# Patient Record
Sex: Male | Born: 1955 | Race: White | Hispanic: No | Marital: Married | Smoking: Current every day smoker
Health system: Southern US, Community
[De-identification: ages and names within clinical notes are randomized; demographics above are authoritative.]

## PROBLEM LIST (undated history)

## (undated) DIAGNOSIS — J449 Chronic obstructive pulmonary disease, unspecified: Secondary | ICD-10-CM

## (undated) HISTORY — PX: KNEE ARTHROSCOPY W/ ACL RECONSTRUCTION: SHX1858

---

## 2001-11-14 ENCOUNTER — Encounter: Admission: RE | Admit: 2001-11-14 | Discharge: 2001-11-14 | Payer: Self-pay | Admitting: Family Medicine

## 2001-11-14 ENCOUNTER — Encounter: Payer: Self-pay | Admitting: Family Medicine

## 2005-07-18 ENCOUNTER — Emergency Department (HOSPITAL_COMMUNITY): Admission: EM | Admit: 2005-07-18 | Discharge: 2005-07-19 | Payer: Self-pay | Admitting: Emergency Medicine

## 2005-12-14 ENCOUNTER — Emergency Department (HOSPITAL_COMMUNITY): Admission: EM | Admit: 2005-12-14 | Discharge: 2005-12-14 | Payer: Self-pay | Admitting: Emergency Medicine

## 2006-02-16 ENCOUNTER — Emergency Department (HOSPITAL_COMMUNITY): Admission: EM | Admit: 2006-02-16 | Discharge: 2006-02-16 | Payer: Self-pay | Admitting: Emergency Medicine

## 2006-02-20 ENCOUNTER — Encounter: Payer: Self-pay | Admitting: Internal Medicine

## 2006-02-20 ENCOUNTER — Ambulatory Visit: Payer: Self-pay | Admitting: Cardiology

## 2006-02-20 ENCOUNTER — Inpatient Hospital Stay (HOSPITAL_COMMUNITY): Admission: EM | Admit: 2006-02-20 | Discharge: 2006-02-21 | Payer: Self-pay | Admitting: Emergency Medicine

## 2007-05-07 ENCOUNTER — Emergency Department (HOSPITAL_COMMUNITY): Admission: EM | Admit: 2007-05-07 | Discharge: 2007-05-07 | Payer: Self-pay | Admitting: Emergency Medicine

## 2007-08-03 ENCOUNTER — Emergency Department (HOSPITAL_COMMUNITY): Admission: EM | Admit: 2007-08-03 | Discharge: 2007-08-03 | Payer: Self-pay | Admitting: Family Medicine

## 2008-01-03 ENCOUNTER — Emergency Department (HOSPITAL_COMMUNITY): Admission: EM | Admit: 2008-01-03 | Discharge: 2008-01-03 | Payer: Self-pay | Admitting: Emergency Medicine

## 2008-01-29 ENCOUNTER — Emergency Department (HOSPITAL_COMMUNITY): Admission: EM | Admit: 2008-01-29 | Discharge: 2008-01-29 | Payer: Self-pay | Admitting: Emergency Medicine

## 2008-06-11 ENCOUNTER — Emergency Department (HOSPITAL_COMMUNITY): Admission: EM | Admit: 2008-06-11 | Discharge: 2008-06-11 | Payer: Self-pay | Admitting: Emergency Medicine

## 2009-01-20 ENCOUNTER — Emergency Department (HOSPITAL_COMMUNITY): Admission: EM | Admit: 2009-01-20 | Discharge: 2009-01-20 | Payer: Self-pay | Admitting: Internal Medicine

## 2009-05-26 ENCOUNTER — Emergency Department (HOSPITAL_COMMUNITY): Admission: EM | Admit: 2009-05-26 | Discharge: 2009-05-27 | Payer: Self-pay | Admitting: Emergency Medicine

## 2009-05-28 ENCOUNTER — Emergency Department (HOSPITAL_COMMUNITY): Admission: EM | Admit: 2009-05-28 | Discharge: 2009-05-28 | Payer: Self-pay | Admitting: Emergency Medicine

## 2009-09-12 ENCOUNTER — Emergency Department (HOSPITAL_COMMUNITY): Admission: EM | Admit: 2009-09-12 | Discharge: 2009-09-12 | Payer: Self-pay | Admitting: Emergency Medicine

## 2009-10-26 ENCOUNTER — Emergency Department (HOSPITAL_COMMUNITY): Admission: EM | Admit: 2009-10-26 | Discharge: 2009-10-26 | Payer: Self-pay | Admitting: Emergency Medicine

## 2010-01-22 ENCOUNTER — Emergency Department (HOSPITAL_COMMUNITY): Admission: EM | Admit: 2010-01-22 | Discharge: 2010-01-22 | Payer: Self-pay | Admitting: Emergency Medicine

## 2010-11-30 LAB — CBC
Hemoglobin: 15.8 g/dL (ref 13.0–17.0)
MCHC: 34.1 g/dL (ref 30.0–36.0)
MCV: 94.3 fL (ref 78.0–100.0)
Platelets: 228 10*3/uL (ref 150–400)
RBC: 4.91 MIL/uL (ref 4.22–5.81)

## 2010-11-30 LAB — BASIC METABOLIC PANEL
CO2: 23 mEq/L (ref 19–32)
Chloride: 109 mEq/L (ref 96–112)
GFR calc Af Amer: >60 mL/min (ref >60)

## 2010-11-30 LAB — DIFFERENTIAL
Basophils Relative: 0 % (ref 0–1)
Eosinophils Relative: 2 % (ref 0–5)
Lymphocytes Relative: 10 % — ABNORMAL LOW (ref 12–46)
Lymphs Abs: 1.6 10*3/uL (ref 0.7–4.0)
Monocytes Relative: 2 % — ABNORMAL LOW (ref 3–12)
Neutrophils Relative %: 85 % — ABNORMAL HIGH (ref 43–77)

## 2010-11-30 LAB — CULTURE, BLOOD (ROUTINE X 2)
Culture: NO GROWTH
Culture: NO GROWTH

## 2010-11-30 LAB — BLOOD GAS, ARTERIAL
Acid-Base Excess: 0.8 mmol/L (ref 0.0–2.0)
Drawn by: 235321
FIO2: 0.21 %
TCO2: 21.3 mmol/L (ref 0–100)
pH, Arterial: 7.43 (ref 7.350–7.450)
pO2, Arterial: 71.1 mmHg — ABNORMAL LOW (ref 80.0–100.0)

## 2010-12-01 LAB — POCT I-STAT, CHEM 8
BUN: 12 mg/dL (ref 6–23)
Calcium, Ion: 1.09 mmol/L — ABNORMAL LOW (ref 1.12–1.32)
Chloride: 106 mEq/L (ref 96–112)
Creatinine, Ser: 1 mg/dL (ref 0.4–1.5)
Glucose, Bld: 93 mg/dL (ref 70–99)
HCT: 46 % (ref 39.0–52.0)
Sodium: 139 mEq/L (ref 135–145)
TCO2: 23 mmol/L (ref 0–100)

## 2010-12-01 LAB — CBC
Platelets: 214 10*3/uL (ref 150–400)
RBC: 4.74 MIL/uL (ref 4.22–5.81)
RDW: 13.6 % (ref 11.5–15.5)

## 2010-12-01 LAB — DIFFERENTIAL
Basophils Relative: 0 % (ref 0–1)
Lymphocytes Relative: 15 % (ref 12–46)
Lymphs Abs: 1.9 10*3/uL (ref 0.7–4.0)

## 2010-12-12 ENCOUNTER — Emergency Department (HOSPITAL_COMMUNITY): Payer: Self-pay

## 2010-12-12 ENCOUNTER — Emergency Department (HOSPITAL_COMMUNITY)
Admission: EM | Admit: 2010-12-12 | Discharge: 2010-12-12 | Disposition: A | Discharge status: Home / Self Care | Payer: Self-pay | Attending: Emergency Medicine | Admitting: Emergency Medicine

## 2010-12-12 DIAGNOSIS — M7989 Other specified soft tissue disorders: Secondary | ICD-10-CM | POA: Insufficient documentation

## 2010-12-12 DIAGNOSIS — L03019 Cellulitis of unspecified finger: Secondary | ICD-10-CM | POA: Insufficient documentation

## 2010-12-12 DIAGNOSIS — M79609 Pain in unspecified limb: Secondary | ICD-10-CM | POA: Insufficient documentation

## 2011-01-12 NOTE — H&P (Signed)
[name], Wells              ACCOUNT NO.:  [id number]   MEDICAL RECORD NO.:  [id number]          PATIENT TYPE:  INP   LOCATION:  1828                         FACILITY:  [facility]   PHYSICIAN:  Corinna L. Lendell Caprice, MDDATE OF BIRTH:  1955-11-06   DATE OF ADMISSION:  02/19/2006  DATE OF DISCHARGE:                                HISTORY & PHYSICAL   CHIEF COMPLAINTS:  Chest pain.   HISTORY OF PRESENT ILLNESS:  Mr. Blanchfield is a 55 year old unassigned white  male who presents to the emergency room with chest pressure.  He had the  same complaint several nights ago and left against medical advice.  He  reports that he has had this chest pressure periodically for the past week;  sometimes it is accompanied by shortness of breath, sometimes it is  accompanied by nausea.  He feels very fatigued.  The pain is gone.  It  lasted an hour today.  He smokes cigarettes.  He admits to using cocaine on  Friday and is somewhat vague about his use but from what I can gather uses  it possibly once a week.  He reports that he has felt this chest pain in the  past but feels that it is unrelated temporally to his cocaine use.  He  smokes two packs of cigarettes a day.  His father had a heart attack in his  early 98s.  The patient has dyspnea on exertion as well.  He has had no  previous cardiac workup.   PAST MEDICAL HISTORY:  He has had knee surgery for a torn cruciate ligament.   SOCIAL HISTORY:  The patient works in [employer].  He snorts cocaine  occasionally.  He smokes two packs of cigarettes a day.  He drinks alcohol  periodically but will not further qualify this.  He is here with his  girlfriend.   FAMILY HISTORY:  His brother has hypertension.  His father has history of  heart disease which began in his early 30s.   REVIEW OF SYSTEMS:  As above, otherwise negative.   PHYSICAL EXAMINATION:  VITAL SIGNS:  His temperature is 97.5, blood pressure  148/85, pulse 56, respiratory rate 18, oxygen  saturation 97% on room air.  GENERAL:  The patient is a tired-appearing white male in no acute distress.  HEENT:  Normocephalic, atraumatic.  Pupils equal, round, reactive to light.  Sclerae nonicteric.  Moist mucous membranes.  Neck is supple.  No  lymphadenopathy.  LUNGS:  Clear to auscultation bilaterally without wheezes, rhonchi or rales.  CARDIOVASCULAR:  Regular rate and rhythm without murmurs, gallops or rubs.  No chest wall tenderness.  ABDOMEN:  Soft, nontender, nondistended.  GU/RECTAL:  Deferred.  EXTREMITIES:  No clubbing, cyanosis or edema.  SKIN:  No rash.  PSYCHIATRIC:  Normal affect, calm and cooperative.  NEUROLOGIC:  Alert and oriented.  Cranial nerves and sensory motor exam are  intact.   LABS:  Two sets of point care enzymes are negative.  BMET is unremarkable.  Hemoglobin/hematocrit normal.  EKG shows sinus bradycardia.  Chest x-ray  shows probable nipple shadows.   ASSESSMENT/PLAN:  1.  Chest pain:  I will admit to rule out myocardial infarction.  Certainly      he has risk factors for heart disease.  I will check a fasting lipid      panel and order a tobacco cessation counseling.  I will continue an      aspirin a day and give Lovenox at ACS dose.  I do believe that it would      be worthwhile discussing with cardiology in the morning since he has      used cocaine within the past week.  They may elect to do an outpatient      stress test once he has been off the cocaine for a few weeks.      Certainly, however, I do feel that he would need an eventual stress test      if not during this hospitalization.  I will also check chest x-ray with      nipple markers.  I will make the patient n.p.o. after midnight in case      he ends up getting a stress test tomorrow.      Corinna L. Lendell Caprice, MD  Electronically Signed     CLS/MEDQ  D:  02/19/2006  T:  02/20/2006  Job:  [id number]

## 2011-04-16 ENCOUNTER — Emergency Department (HOSPITAL_COMMUNITY)
Admission: EM | Admit: 2011-04-16 | Discharge: 2011-04-16 | Disposition: A | Discharge status: Home / Self Care | Payer: Self-pay | Attending: Emergency Medicine | Admitting: Emergency Medicine

## 2011-04-16 DIAGNOSIS — R630 Anorexia: Secondary | ICD-10-CM | POA: Insufficient documentation

## 2011-04-16 DIAGNOSIS — R221 Localized swelling, mass and lump, neck: Secondary | ICD-10-CM | POA: Insufficient documentation

## 2011-04-16 DIAGNOSIS — R220 Localized swelling, mass and lump, head: Secondary | ICD-10-CM | POA: Insufficient documentation

## 2011-04-16 DIAGNOSIS — K029 Dental caries, unspecified: Secondary | ICD-10-CM | POA: Insufficient documentation

## 2011-04-16 DIAGNOSIS — Z8639 Personal history of other endocrine, nutritional and metabolic disease: Secondary | ICD-10-CM | POA: Insufficient documentation

## 2011-04-16 DIAGNOSIS — K089 Disorder of teeth and supporting structures, unspecified: Secondary | ICD-10-CM | POA: Insufficient documentation

## 2011-04-16 DIAGNOSIS — Z862 Personal history of diseases of the blood and blood-forming organs and certain disorders involving the immune mechanism: Secondary | ICD-10-CM | POA: Insufficient documentation

## 2011-11-27 ENCOUNTER — Emergency Department (HOSPITAL_BASED_OUTPATIENT_CLINIC_OR_DEPARTMENT_OTHER)
Admission: EM | Admit: 2011-11-27 | Discharge: 2011-11-27 | Disposition: A | Discharge status: Home / Self Care | Payer: 59 | Attending: Emergency Medicine | Admitting: Emergency Medicine

## 2011-11-27 ENCOUNTER — Encounter (HOSPITAL_BASED_OUTPATIENT_CLINIC_OR_DEPARTMENT_OTHER): Payer: Self-pay | Admitting: *Deleted

## 2011-11-27 ENCOUNTER — Other Ambulatory Visit: Payer: Self-pay

## 2011-11-27 ENCOUNTER — Emergency Department (INDEPENDENT_AMBULATORY_CARE_PROVIDER_SITE_OTHER): Payer: 59

## 2011-11-27 DIAGNOSIS — F17200 Nicotine dependence, unspecified, uncomplicated: Secondary | ICD-10-CM | POA: Insufficient documentation

## 2011-11-27 DIAGNOSIS — M47814 Spondylosis without myelopathy or radiculopathy, thoracic region: Secondary | ICD-10-CM

## 2011-11-27 DIAGNOSIS — R0602 Shortness of breath: Secondary | ICD-10-CM

## 2011-11-27 DIAGNOSIS — J441 Chronic obstructive pulmonary disease with (acute) exacerbation: Secondary | ICD-10-CM | POA: Insufficient documentation

## 2011-11-27 DIAGNOSIS — J45909 Unspecified asthma, uncomplicated: Secondary | ICD-10-CM

## 2011-11-27 DIAGNOSIS — R5381 Other malaise: Secondary | ICD-10-CM | POA: Insufficient documentation

## 2011-11-27 HISTORY — DX: Chronic obstructive pulmonary disease, unspecified: J44.9

## 2011-11-27 LAB — DIFFERENTIAL
Basophils Absolute: 0 K/uL (ref 0.0–0.1)
Basophils Relative: 0 % (ref 0–1)
Eosinophils Absolute: 0.1 K/uL (ref 0.0–0.7)
Eosinophils Relative: 1 % (ref 0–5)
Lymphocytes Relative: 17 % (ref 12–46)
Lymphs Abs: 1.2 K/uL (ref 0.7–4.0)
Monocytes Absolute: 1 K/uL (ref 0.1–1.0)
Monocytes Relative: 14 % — ABNORMAL HIGH (ref 3–12)
Neutro Abs: 4.7 K/uL (ref 1.7–7.7)
Neutrophils Relative %: 67 % (ref 43–77)

## 2011-11-27 LAB — CBC
HCT: 45.5 % (ref 39.0–52.0)
Hemoglobin: 15.6 g/dL (ref 13.0–17.0)
MCH: 30.8 pg (ref 26.0–34.0)
MCHC: 34.3 g/dL (ref 30.0–36.0)
MCV: 89.7 fL (ref 78.0–100.0)
Platelets: 169 K/uL (ref 150–400)
RBC: 5.07 MIL/uL (ref 4.22–5.81)
RDW: 12.8 % (ref 11.5–15.5)
WBC: 6.9 K/uL (ref 4.0–10.5)

## 2011-11-27 LAB — BASIC METABOLIC PANEL WITH GFR
BUN: 15 mg/dL (ref 6–23)
CO2: 22 meq/L (ref 19–32)
Calcium: 9.2 mg/dL (ref 8.4–10.5)
Chloride: 102 meq/L (ref 96–112)
Creatinine, Ser: 0.9 mg/dL (ref 0.50–1.35)
GFR calc Af Amer: >90 mL/min (ref >90)
GFR calc non Af Amer: >90 mL/min (ref >90)
Glucose, Bld: 110 mg/dL — ABNORMAL HIGH (ref 70–99)
Potassium: 4 meq/L (ref 3.5–5.1)
Sodium: 137 meq/L (ref 135–145)

## 2011-11-27 MED ORDER — IPRATROPIUM BROMIDE 0.02 % IN SOLN
RESPIRATORY_TRACT | Status: AC
  Administered 2011-11-27: 0.5 mg via RESPIRATORY_TRACT
  Filled 2011-11-27: qty 2.5

## 2011-11-27 MED ORDER — DOXYCYCLINE HYCLATE 100 MG PO CAPS: 100.0000 mg | ORAL_CAPSULE | Freq: Two times a day (BID) | ORAL | Status: AC

## 2011-11-27 MED ORDER — PREDNISONE 50 MG PO TABS
60.0000 mg | ORAL_TABLET | Freq: Once | ORAL | Status: AC
  Administered 2011-11-27: 60 mg via ORAL
  Filled 2011-11-27: qty 1

## 2011-11-27 MED ORDER — SODIUM CHLORIDE 0.9 % IV SOLN: INTRAVENOUS | Status: DC

## 2011-11-27 MED ORDER — ALBUTEROL SULFATE (5 MG/ML) 0.5% IN NEBU
5.0000 mg | INHALATION_SOLUTION | Freq: Once | RESPIRATORY_TRACT | Status: AC
  Administered 2011-11-27: 5 mg via RESPIRATORY_TRACT

## 2011-11-27 MED ORDER — IPRATROPIUM BROMIDE 0.02 % IN SOLN
0.5000 mg | Freq: Once | RESPIRATORY_TRACT | Status: AC
  Administered 2011-11-27: 0.5 mg via RESPIRATORY_TRACT

## 2011-11-27 MED ORDER — ALBUTEROL (5 MG/ML) CONTINUOUS INHALATION SOLN
10.0000 mg/h | INHALATION_SOLUTION | Freq: Once | RESPIRATORY_TRACT | Status: AC
  Administered 2011-11-27: 10 mg/h via RESPIRATORY_TRACT
  Filled 2011-11-27: qty 20

## 2011-11-27 MED ORDER — ALBUTEROL SULFATE (5 MG/ML) 0.5% IN NEBU
INHALATION_SOLUTION | RESPIRATORY_TRACT | Status: AC
  Filled 2011-11-27: qty 0.5

## 2011-11-27 MED ORDER — PREDNISONE 50 MG PO TABS: ORAL_TABLET | ORAL | Status: AC

## 2011-11-27 MED ORDER — ALBUTEROL SULFATE HFA 108 (90 BASE) MCG/ACT IN AERS: 1.0000 | INHALATION_SPRAY | Freq: Four times a day (QID) | RESPIRATORY_TRACT | Status: DC | PRN

## 2011-11-27 MED ORDER — SODIUM CHLORIDE 0.9 % IV BOLUS (SEPSIS)
1000.0000 mL | Freq: Once | INTRAVENOUS | Status: AC
  Administered 2011-11-27: 1000 mL via INTRAVENOUS

## 2011-11-27 MED ORDER — ALBUTEROL SULFATE (5 MG/ML) 0.5% IN NEBU
INHALATION_SOLUTION | RESPIRATORY_TRACT | Status: AC
  Administered 2011-11-27: 5 mg via RESPIRATORY_TRACT
  Filled 2011-11-27: qty 1
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
## 2011-11-27 NOTE — ED Notes (Signed)
Pt was ambulated x 1 around the ED on RA, pt tolerated well and oxygen saturation only went as low as 93% but when pt was stationary oxygen levels were 97%. Pt stated that he felt well.

## 2011-11-27 NOTE — ED Notes (Signed)
Sunday Pt. Began with shortness of breath and Monday Pt. Became worse with SHOB and fatigue.  Pt. Is in no resp. Distress and has wheezes bilat.

## 2011-11-27 NOTE — ED Notes (Signed)
Pt presented to the ED with SOB, increased WOB, and wheezing. Patient was just started on a continuous nebulizer with Albuterol 10 mg which will be given over 1 hour. BBS- expiratory wheezes. Patient is currently tolerating nebulizer well with no complications. Rt will cont to monitor.

## 2011-11-27 NOTE — ED Notes (Signed)
Pt was assess on his CAT HHN tx, pt seemed to be doing well and stated that he didn't feel as tight as he was when he came in. Respiratory will walk the pt on a POX room air trial to see how pt tolerates ambulation.

## 2011-11-27 NOTE — Discharge Instructions (Signed)
Chronic Obstructive Pulmonary Disease Chronic obstructive pulmonary disease (COPD) is a condition in which airflow from the lungs is restricted. The lungs can never return to normal, but there are measures you can take which will improve them and make you feel better. CAUSES   Smoking.   Exposure to secondhand smoke.   Breathing in irritants (pollution, cigarette smoke, strong smells, aerosol sprays, paint fumes).   History of lung infections.  TREATMENT  Treatment focuses on making you comfortable (supportive care). Your caregiver may prescribe medications (inhaled or pills) to help improve your breathing. HOME CARE INSTRUCTIONS   If you smoke, stop smoking.   Avoid exposure to smoke, chemicals, and fumes that aggravate your breathing.   Take antibiotic medicines as directed by your caregiver.   Avoid medicines that dry up your system and slow down the elimination of secretions (antihistamines and cough syrups). This decreases respiratory capacity and may lead to infections.   Drink enough water and fluids to keep your urine clear or pale yellow. This loosens secretions.   Use humidifiers at home and at your bedside if they do not make breathing difficult.   Receive all protective vaccines your caregiver suggests, especially pneumococcal and influenza.   Use home oxygen as suggested.   Stay active. Exercise and physical activity will help maintain your ability to do things you want to do.   Eat a healthy diet.  SEEK MEDICAL CARE IF:   You develop pus-like mucus (sputum).   Breathing is more labored or exercise becomes difficult to do.   You are running out of the medicine you take for your breathing.  SEEK IMMEDIATE MEDICAL CARE IF:   You have a rapid heart rate.   You have agitation, confusion, tremors, or are in a stupor (family members may need to observe this).   It becomes difficult to breathe.   You develop chest pain.   You have a fever.  MAKE SURE YOU:    Understand these instructions.   Will watch your condition.   Will get help right away if you are not doing well or get worse.  Document Released: 05/23/2005 Document Revised: 08/02/2011 Document Reviewed: 10/13/2010 [facility] Patient Information 2012 [place], [place].

## 2011-11-27 NOTE — ED Provider Notes (Signed)
History     CSN: [id number]  Arrival date & time 11/27/11  1826   First MD Initiated Contact with Patient 11/27/11 1844      Chief Complaint  Patient presents with  . Shortness of Breath    also c/o fatigue    (Consider location/radiation/quality/duration/timing/severity/associated sxs/prior treatment) HPI Comments: Patient complains of 2 days of shortness of breath, fatigue and generalized weakness. He has a history of COPD smokes one pack a day. He sees a nebulizer tonight twice at home without relief. He denies any chest pain, cough, fever, abdominal pain, nausea vomiting. He has never been hospitalized for COPD.  The history is provided by the patient and the spouse.    Past Medical History  Diagnosis Date  . COPD (chronic obstructive pulmonary disease)   . Asthma     Past Surgical History  Procedure Date  . Knee arthroscopy w/ acl reconstruction     No family history on file.  History  Substance Use Topics  . Smoking status: Not on file  . Smokeless tobacco: Not on file  . Alcohol Use:       Review of Systems  Constitutional: Positive for activity change, appetite change and fatigue. Negative for fever.  HENT: Positive for congestion and rhinorrhea.   Eyes: Negative for photophobia.  Respiratory: Positive for cough and shortness of breath. Negative for choking.   Cardiovascular: Negative for chest pain.  Gastrointestinal: Negative for nausea, vomiting and abdominal pain.  Genitourinary: Negative for dysuria.  Musculoskeletal: Negative for back pain.  Neurological: Negative for headaches.    Allergies  Review of patient's allergies indicates no known allergies.  Home Medications   Current Outpatient Rx  Name Route Sig Dispense Refill  . ALBUTEROL SULFATE HFA 108 (90 BASE) MCG/ACT IN AERS Inhalation Inhale 2 puffs into the lungs every 6 (six) hours as needed. For shortness of breath    . ALBUTEROL SULFATE (2.5 MG/3ML) 0.083% IN NEBU Nebulization Take  2.5 mg by nebulization every 6 (six) hours as needed. For shortness of breath    . AMLODIPINE BESYLATE PO Oral Take 1 tablet by mouth daily.    Marland Kitchen HYDROCODONE-IBUPROFEN 7.5-200 MG PO TABS Oral Take 1 tablet by mouth every 8 (eight) hours as needed. For pain    . PHENYLEPHRINE-APAP-GUAIFENESIN 10-650-400 MG/20ML PO LIQD Oral Take 20 mLs by mouth every 4 (four) hours as needed. For congestion      BP 107/53  Pulse 78  Temp(Src) 97.9 F (36.6 C) (Oral)  Resp 22  Ht 6' (1.829 m)  Wt 210 lb (95.255 kg)  BMI 28.48 kg/m2  SpO2 95%  Physical Exam  Constitutional: He is oriented to person, place, and time. He appears well-developed and well-nourished. No distress.  HENT:  Head: Normocephalic and atraumatic.  Mouth/Throat: Oropharynx is clear and moist. No oropharyngeal exudate.  Eyes: Conjunctivae are normal. Pupils are equal, round, and reactive to light.  Neck: Normal range of motion. Neck supple.  Cardiovascular: Normal rate, regular rhythm and normal heart sounds.   Pulmonary/Chest: Effort normal. He has wheezes.       Course breath sounds bilaterally with scattered wheezing good air exchange  Abdominal: Soft. There is no tenderness. There is no rebound and no guarding.  Musculoskeletal: Normal range of motion. He exhibits no edema and no tenderness.  Neurological: He is alert and oriented to person, place, and time. No cranial nerve deficit.  Skin: Skin is warm.    ED Course  Procedures (including critical care time)  Labs Reviewed  CBC  DIFFERENTIAL  BASIC METABOLIC PANEL  TROPONIN I   Dg Chest Port 1 View  11/27/2011  *RADIOLOGY REPORT*  Clinical Data: Shortness of breath.  History of asthma.  PORTABLE CHEST - 1 VIEW  Comparison: 05/28/2009  Findings: Accentuated lung markings are probably due to the semi erect AP nature of today's projection.  No overt airway thickening is observed.  No airspace opacity is identified.  Cardiac and mediastinal contours appear unremarkable.   Lower thoracic spondylosis noted.  IMPRESSION: 1.  Lower thoracic spondylosis.   Otherwise, no significant abnormality identified.  Original Report Authenticated By: Dellia Cloud, M.D.     No diagnosis found.    MDM  Shortness of breath consistent with COPD exacerbation. No chest pain, fever or cough. No hypoxia.  Given extent of wheezing patient placed on continuous nebulizer on arrival. At end of hour-long continuous nebulizer. His work of breathing has improved. He no longer feels short of breath. Remains coarse breath sounds bilaterally. He ambulated in the ED without any desaturations. He denies any chest pain or shortness of breath.  He feels ready to go home. We'll treat for COPD exacerbation with doxycycline, albuterol, prednisone.    Date: 11/27/2011  Rate: 64  Rhythm: normal sinus rhythm  QRS Axis: normal  Intervals: normal  ST/T Wave abnormalities: nonspecific ST changes  Conduction Disutrbances:none  Narrative Interpretation:   Old EKG Reviewed: unchanged  CRITICAL CARE Performed by: Glynn Octave   Total critical care time: 30  Critical care time was exclusive of separately billable procedures and treating other patients.  Critical care was necessary to treat or prevent imminent or life-threatening deterioration.  Critical care was time spent personally by me on the following activities: development of treatment plan with patient and/or surrogate as well as nursing, discussions with consultants, evaluation of patient's response to treatment, examination of patient, obtaining history from patient or surrogate, ordering and performing treatments and interventions, ordering and review of laboratory studies, ordering and review of radiographic studies, pulse oximetry and re-evaluation of patient's condition.         Glynn Octave, MD 11/28/11 1018

## 2011-11-28 NOTE — ED Notes (Signed)
Pt called requesting that we cancel prescriptions called into [facility] and call them into [facility] on [address]. Request granted by Lorre Nick and call made.

## 2012-04-09 ENCOUNTER — Emergency Department (HOSPITAL_COMMUNITY): Payer: 59

## 2012-04-09 ENCOUNTER — Emergency Department (HOSPITAL_COMMUNITY)
Admission: EM | Admit: 2012-04-09 | Discharge: 2012-04-09 | Disposition: A | Discharge status: Home / Self Care | Payer: 59 | Attending: Emergency Medicine | Admitting: Emergency Medicine

## 2012-04-09 DIAGNOSIS — R4182 Altered mental status, unspecified: Secondary | ICD-10-CM | POA: Insufficient documentation

## 2012-04-09 DIAGNOSIS — Z79899 Other long term (current) drug therapy: Secondary | ICD-10-CM | POA: Insufficient documentation

## 2012-04-09 DIAGNOSIS — J449 Chronic obstructive pulmonary disease, unspecified: Secondary | ICD-10-CM | POA: Insufficient documentation

## 2012-04-09 DIAGNOSIS — R509 Fever, unspecified: Secondary | ICD-10-CM | POA: Insufficient documentation

## 2012-04-09 DIAGNOSIS — J4489 Other specified chronic obstructive pulmonary disease: Secondary | ICD-10-CM | POA: Insufficient documentation

## 2012-04-09 LAB — COMPREHENSIVE METABOLIC PANEL
Albumin: 3.7 g/dL (ref 3.5–5.2)
Alkaline Phosphatase: 81 U/L (ref 39–117)
BUN: 13 mg/dL (ref 6–23)
CO2: 24 mEq/L (ref 19–32)
Chloride: 101 mEq/L (ref 96–112)
Creatinine, Ser: 0.94 mg/dL (ref 0.50–1.35)
GFR calc Af Amer: >90 mL/min (ref >90)
GFR calc non Af Amer: >90 mL/min (ref >90)
Glucose, Bld: 85 mg/dL (ref 70–99)
Potassium: 3.6 mEq/L (ref 3.5–5.1)
Total Bilirubin: 0.5 mg/dL (ref 0.3–1.2)

## 2012-04-09 LAB — URINALYSIS, ROUTINE W REFLEX MICROSCOPIC
Glucose, UA: NEGATIVE mg/dL
Ketones, ur: NEGATIVE mg/dL
Leukocytes, UA: NEGATIVE
Protein, ur: NEGATIVE mg/dL
Urobilinogen, UA: 1 mg/dL (ref 0.0–1.0)

## 2012-04-09 LAB — CBC
HCT: 38 % — ABNORMAL LOW (ref 39.0–52.0)
Hemoglobin: 13.3 g/dL (ref 13.0–17.0)
MCV: 88.4 fL (ref 78.0–100.0)
RBC: 4.3 MIL/uL (ref 4.22–5.81)
WBC: 9.7 10*3/uL (ref 4.0–10.5)

## 2012-04-09 LAB — CSF CELL COUNT WITH DIFFERENTIAL

## 2012-04-09 LAB — PROTIME-INR
INR: 1.16 (ref 0.00–1.49)
Prothrombin Time: 15 seconds (ref 11.6–15.2)

## 2012-04-09 LAB — GLUCOSE, CAPILLARY
Glucose-Capillary: 83 mg/dL (ref 70–99)
Glucose-Capillary: 88 mg/dL (ref 70–99)

## 2012-04-09 LAB — GRAM STAIN: Gram Stain: NONE SEEN

## 2012-04-09 LAB — PROTEIN AND GLUCOSE, CSF: Total  Protein, CSF: 48 mg/dL — ABNORMAL HIGH (ref 15–45)

## 2012-04-09 LAB — APTT: aPTT: 37 seconds (ref 24–37)

## 2012-04-09 MED ORDER — ACETAMINOPHEN 325 MG PO TABS
650.0000 mg | ORAL_TABLET | Freq: Once | ORAL | Status: AC
  Administered 2012-04-09: 650 mg via ORAL
  Filled 2012-04-09: qty 2

## 2012-04-09 MED ORDER — VANCOMYCIN HCL 1000 MG IV SOLR
1500.0000 mg | Freq: Once | INTRAVENOUS | Status: AC
  Administered 2012-04-09: 1500 mg via INTRAVENOUS
  Filled 2012-04-09: qty 1500

## 2012-04-09 MED ORDER — DEXTROSE 5 % IV SOLN
2.0000 g | Freq: Once | INTRAVENOUS | Status: AC
  Administered 2012-04-09: 2 g via INTRAVENOUS
  Filled 2012-04-09: qty 2

## 2012-04-09 MED ORDER — SODIUM CHLORIDE 0.9 % IV BOLUS (SEPSIS)
1000.0000 mL | INTRAVENOUS | Status: AC
  Administered 2012-04-09: 1000 mL via INTRAVENOUS

## 2012-04-09 NOTE — Progress Notes (Signed)
Pt/male family confirms pcp dr Karren Burly williams on [address] [place] EPIC updated

## 2012-04-09 NOTE — ED Provider Notes (Addendum)
History     CSN: [id number]  Arrival date & time 04/09/12  1610   First MD Initiated Contact with Patient 04/09/12 1646      Chief Complaint  Patient presents with  . Altered Mental Status  . Fever    (Consider location/radiation/quality/duration/timing/severity/associated sxs/prior treatment) HPI Comments: Mr. Patras presents with his wife for evaluation of altered mental status.  He states that he had a throbbing, left-sided headache last night that improved prior to him going to work this morning.  He was able to prepare for and drive to work without difficulty.  Shortly after arriving to work he experienced the sudden onset of chills, followed by feeling extremely warm and flushed.  He called his wife was encouraged to return home.  During the 1 hr commute from the work place to his home Mr. Mcginness became confused.  He talked to his wife on the phone several times and could not explain his location.  He became lost and eventually had to pull over to the side of the road where his wife found him at a gas station in [place].  She states that his skin felt as if it were on fire.  She states he could not keep his eyes open or his head up and behaved as if he were a "crazy person".  She brought him to the emergency department.    Patient is a 56 y.o. male presenting with altered mental status and fever. The history is provided by the patient and the spouse. History Limited By: pt is somnolent, but answers questions.  both his wife and bedside staff  note improvement in his mental status over the last 10 minutes here in the ER. No language interpreter was used.  Altered Mental Status This is a new problem. The current episode started 3 to 5 hours ago. The problem has been gradually improving. Associated symptoms include headaches and shortness of breath. Pertinent negatives include no chest pain and no abdominal pain. Nothing aggravates the symptoms. Nothing relieves the symptoms. He has tried  nothing for the symptoms.  Fever Primary symptoms of the febrile illness include fever, fatigue, headaches, cough, shortness of breath, altered mental status and arthralgias. Primary symptoms do not include wheezing, abdominal pain, nausea, vomiting, diarrhea, myalgias or rash.  The headache is associated with weakness. The headache is not associated with photophobia, eye pain or neck stiffness.    Past Medical History  Diagnosis Date  . COPD (chronic obstructive pulmonary disease)   . Asthma     Past Surgical History  Procedure Date  . Knee arthroscopy w/ acl reconstruction     No family history on file.  History  Substance Use Topics  . Smoking status: Not on file  . Smokeless tobacco: Not on file  . Alcohol Use:       Review of Systems  Constitutional: Positive for fever, chills, activity change and fatigue. Negative for diaphoresis, appetite change and unexpected weight change.  HENT: Negative for hearing loss, ear pain, nosebleeds, congestion, sore throat, facial swelling, rhinorrhea, mouth sores, trouble swallowing, neck pain, neck stiffness, voice change, sinus pressure, tinnitus and ear discharge.   Eyes: Negative for photophobia, pain, redness and visual disturbance.  Respiratory: Positive for cough and shortness of breath. Negative for apnea, choking, chest tightness, wheezing and stridor.        Chronic  Cardiovascular: Negative for chest pain, palpitations and leg swelling.  Gastrointestinal: Negative for nausea, vomiting, abdominal pain, diarrhea, constipation and abdominal distention.  Genitourinary: Negative for urgency, frequency, hematuria, flank pain, decreased urine volume, enuresis and difficulty urinating.  Musculoskeletal: Positive for joint swelling and arthralgias. Negative for myalgias, back pain and gait problem.       C/o right ankle pain for quite some time.  States that it has increased in it's persistence over the last 2 weeks or so.  Reports  swelling also.  Pain is worse in evening after being active or working.  Skin: Negative for color change, pallor, rash and wound.  Neurological: Positive for weakness, light-headedness and headaches. Negative for dizziness, tremors, seizures, syncope, facial asymmetry and numbness.  Psychiatric/Behavioral: Positive for confusion, decreased concentration and altered mental status. Negative for hallucinations, behavioral problems, dysphoric mood and agitation.    Allergies  Review of patient's allergies indicates no known allergies.  Home Medications   Current Outpatient Rx  Name Route Sig Dispense Refill  . ALBUTEROL SULFATE HFA 108 (90 BASE) MCG/ACT IN AERS Inhalation Inhale 2 puffs into the lungs every 6 (six) hours as needed. For shortness of breath    . ALBUTEROL SULFATE HFA 108 (90 BASE) MCG/ACT IN AERS Inhalation Inhale 1-2 puffs into the lungs every 6 (six) hours as needed for wheezing. 1 Inhaler 0  . ALBUTEROL SULFATE (2.5 MG/3ML) 0.083% IN NEBU Nebulization Take 2.5 mg by nebulization every 6 (six) hours as needed. For shortness of breath    . AMLODIPINE BESYLATE PO Oral Take 1 tablet by mouth daily.    Marland Kitchen HYDROCODONE-IBUPROFEN 7.5-200 MG PO TABS Oral Take 1 tablet by mouth every 8 (eight) hours as needed. For pain    . PHENYLEPHRINE-APAP-GUAIFENESIN 10-650-400 MG/20ML PO LIQD Oral Take 20 mLs by mouth every 4 (four) hours as needed. For congestion      BP 132/80  Pulse 85  Temp 103.6 F (39.8 C) (Oral)  Resp 19  SpO2 99%  Physical Exam  Vitals reviewed. Constitutional: He appears well-developed and well-nourished. He appears lethargic. He is easily aroused. He does not have a sickly appearance. He appears ill. No distress. He is not intubated.  HENT:  Head: Normocephalic and atraumatic. Head is without raccoon's eyes, without Battle's sign and without contusion. No trismus in the jaw.  Right Ear: Hearing, external ear and ear canal normal. No drainage or tenderness. No  mastoid tenderness. Tympanic membrane is not injected and not bulging. No middle ear effusion. No hemotympanum.  Left Ear: Hearing, external ear and ear canal normal. No drainage or tenderness. No mastoid tenderness. Tympanic membrane is not injected and not bulging.  No middle ear effusion. No hemotympanum.  Nose: Nose normal. No mucosal edema, rhinorrhea, sinus tenderness, septal deviation or nasal septal hematoma. No epistaxis.  Mouth/Throat: Uvula is midline, oropharynx is clear and moist and mucous membranes are normal. Mucous membranes are not pale, not dry and not cyanotic. No oral lesions. Normal dentition. No dental abscesses, uvula swelling, lacerations or dental caries. No oropharyngeal exudate or tonsillar abscesses.  Eyes: Conjunctivae and EOM are normal. Pupils are equal, round, and reactive to light. Right eye exhibits no discharge. Left eye exhibits no discharge. No scleral icterus. Right eye exhibits no nystagmus. Left eye exhibits no nystagmus.  Neck: Normal range of motion and full passive range of motion without pain. Neck supple. Normal carotid pulses and no JVD present. No muscular tenderness present. Carotid bruit is not present. No rigidity. No tracheal deviation, no edema, no erythema and normal range of motion present. No Brudzinski's sign and no Kernig's sign noted. No thyromegaly present.  Cardiovascular: Normal rate, regular rhythm and intact distal pulses.  Exam reveals no gallop and no friction rub.   No murmur heard. Pulmonary/Chest: Effort normal. No accessory muscle usage or stridor. No apnea, not tachypneic and not bradypneic. He is not intubated. No respiratory distress. He has no decreased breath sounds. He has no wheezes. He has rhonchi. He has no rales. He exhibits no tenderness.       Coarse breath sounds + ronchi  Abdominal: Soft. Bowel sounds are normal. He exhibits no distension and no mass. There is no tenderness. There is no rebound and no guarding.    Musculoskeletal: Normal range of motion. He exhibits tenderness. He exhibits no edema.  Lymphadenopathy:    He has no cervical adenopathy.  Neurological: He is easily aroused. He appears lethargic. He is disoriented. No cranial nerve deficit or sensory deficit. Coordination normal. GCS eye subscore is 4. GCS verbal subscore is 4. GCS motor subscore is 6.       Oriented to person, place  Skin: Skin is warm and dry. No rash noted. He is not diaphoretic. No erythema.  Psychiatric: He has a normal mood and affect. His behavior is normal. Judgment and thought content normal.    ED Course  LUMBAR PUNCTURE Date/Time: 04/09/2012 8:54 PM Performed by: Dana Allan T Authorized by: Dana Allan T Consent: Verbal consent obtained. Written consent obtained. Risks and benefits: risks, benefits and alternatives were discussed Consent given by: patient Patient understanding: patient states understanding of the procedure being performed Patient consent: the patient's understanding of the procedure matches consent given Procedure consent: procedure consent matches procedure scheduled Relevant documents: relevant documents present and verified Test results: test results available and properly labeled Site marked: the operative site was marked Imaging studies: imaging studies available Required items: required blood products, implants, devices, and special equipment available Patient identity confirmed: verbally with patient and arm band Time out: Immediately prior to procedure a "time out" was called to verify the correct patient, procedure, equipment, support staff and site/side marked as required. Indications: evaluation for infection and evaluation for altered mental status Anesthesia: local infiltration Local anesthetic: lidocaine 1% without epinephrine Anesthetic total: 2 ml Patient sedated: no Preparation: Patient was prepped and draped in the usual sterile fashion. Lumbar space: L3-L4  interspace Patient's position: right lateral decubitus Needle type: spinal needle - Quincke tip Needle length: 3.5 in Number of attempts: 2 Fluid appearance: clear Tubes of fluid: 4 Total volume: 5 ml Post-procedure: site cleaned and adhesive bandage applied Patient tolerance: Patient tolerated the procedure well with no immediate complications. Comments: On 1st attempt pt c/o severe transient pain that radiated down 1 leg, the needle was removed completely and re-inserted at a new site.  No further issues encountered.   (including critical care time)   Labs Reviewed  GLUCOSE, CAPILLARY  GLUCOSE, CAPILLARY  CULTURE, BLOOD (ROUTINE X 2)  CULTURE, BLOOD (ROUTINE X 2)  CBC  COMPREHENSIVE METABOLIC PANEL  URINALYSIS, ROUTINE W REFLEX MICROSCOPIC  URINE CULTURE  PROTIME-INR  APTT   No results found.   No diagnosis found.   Date: 04/09/2012  Rate: 88 bpm  Rhythm: sinus  QRS Axis: normal  Intervals: normal  ST/T Wave abnormalities: nonspecific ST changes  Conduction Disutrbances:none  Narrative Interpretation:   Old EKG Reviewed: unchanged      MDM  Pt presents altered with labile mental status, high fever, and headache - he is currently somnolent but easily aroused, with stable vital signs.  No distress noted.  Pt appears delerius.  He is able to provide history and both he and his wife states this has never occurred previously.  Plan initiate a broad work-up to look for a source of fever.    Given his complaint of headache that has improved but is still present, will administer antibiotics for meningitis.  Will obtain a CT scan of the head.  If no other source presents itself will also obtain a LP for CSF analysis.  LP was performed.  The CSF analysis demonstrates no evidence of ICH or acute meningitis.  Mr. Doyon mental status steadily improved over his first hour here in the ER and he was completely alert and oriented prior to performing the LP.  He has remained  oriented and appears nontoxic.  His VS have been stable now over greater than 6 hrs ER obs.  He has no acute abnormalities noted on the CT of his head and no evidence of PNA on chest xray.  The electrolytes, CBC, and urinalysis are also essentially normal.  The only explanation for the alteration in his mental status tonight has been fever.  He did receive antibiotics early during his evaluation secondary to fever and altered mental status but his physical exam now is not consistent with meningeal irritation.  Plan discharge home to follow-up here in the emergency department for a repeat evaluation.  He has been advised to return immediately if the altered mental status reoccurs.        Tobin Chad, MD 04/09/12 1610  Tobin Chad, MD 04/09/12 2328

## 2012-04-11 LAB — URINE CULTURE: Colony Count: NO GROWTH

## 2012-04-13 LAB — CSF CULTURE W GRAM STAIN: Culture: NO GROWTH

## 2012-04-15 LAB — CULTURE, BLOOD (ROUTINE X 2): Culture: NO GROWTH

## 2014-08-28 ENCOUNTER — Encounter (HOSPITAL_BASED_OUTPATIENT_CLINIC_OR_DEPARTMENT_OTHER): Payer: Self-pay | Admitting: *Deleted

## 2014-08-28 ENCOUNTER — Emergency Department (HOSPITAL_BASED_OUTPATIENT_CLINIC_OR_DEPARTMENT_OTHER): Payer: Self-pay

## 2014-08-28 ENCOUNTER — Emergency Department (HOSPITAL_BASED_OUTPATIENT_CLINIC_OR_DEPARTMENT_OTHER)
Admission: EM | Admit: 2014-08-28 | Discharge: 2014-08-28 | Disposition: A | Discharge status: Home / Self Care | Payer: Self-pay | Attending: Emergency Medicine | Admitting: Emergency Medicine

## 2014-08-28 DIAGNOSIS — Y929 Unspecified place or not applicable: Secondary | ICD-10-CM | POA: Insufficient documentation

## 2014-08-28 DIAGNOSIS — Z720 Tobacco use: Secondary | ICD-10-CM | POA: Insufficient documentation

## 2014-08-28 DIAGNOSIS — Y9389 Activity, other specified: Secondary | ICD-10-CM | POA: Insufficient documentation

## 2014-08-28 DIAGNOSIS — S53401A Unspecified sprain of right elbow, initial encounter: Secondary | ICD-10-CM | POA: Insufficient documentation

## 2014-08-28 DIAGNOSIS — Z79899 Other long term (current) drug therapy: Secondary | ICD-10-CM | POA: Insufficient documentation

## 2014-08-28 DIAGNOSIS — X58XXXA Exposure to other specified factors, initial encounter: Secondary | ICD-10-CM | POA: Insufficient documentation

## 2014-08-28 DIAGNOSIS — Y998 Other external cause status: Secondary | ICD-10-CM | POA: Insufficient documentation

## 2014-08-28 DIAGNOSIS — J449 Chronic obstructive pulmonary disease, unspecified: Secondary | ICD-10-CM | POA: Insufficient documentation

## 2014-08-28 MED ORDER — HYDROCODONE-ACETAMINOPHEN 5-325 MG PO TABS: 1.0000 | ORAL_TABLET | Freq: Four times a day (QID) | ORAL | Status: DC | PRN

## 2014-08-28 MED ORDER — ACETAMINOPHEN 500 MG PO TABS
1000.0000 mg | ORAL_TABLET | Freq: Once | ORAL | Status: AC
  Administered 2014-08-28: 1000 mg via ORAL
  Filled 2014-08-28: qty 2

## 2014-08-28 NOTE — ED Provider Notes (Signed)
CSN: [id number]     Arrival date & time 08/28/14  1120 History   First MD Initiated Contact with Patient 08/28/14 1139     Chief Complaint  Patient presents with  . Elbow Pain     (Consider location/radiation/quality/duration/timing/severity/associated sxs/prior Treatment) HPI  59 year old male presents with 2 days of right elbow pain. 2 nights ago he was picking up his grandchild with one arm and felt a pain on the side of his elbow. The next day he noticed swelling focally. Has not has any redness. Range of motion increases his pain. He's been taking ibuprofen and icing without relief. No weakness or numbness. No fevers.  Past Medical History  Diagnosis Date  . COPD (chronic obstructive pulmonary disease)   . Asthma    Past Surgical History  Procedure Laterality Date  . Knee arthroscopy w/ acl reconstruction     No family history on file. History  Substance Use Topics  . Smoking status: Current Every Day Smoker  . Smokeless tobacco: Not on file  . Alcohol Use: No    Review of Systems  Musculoskeletal: Positive for joint swelling and arthralgias.  Skin: Negative for color change and wound.  Neurological: Negative for weakness and numbness.  All other systems reviewed and are negative.     Allergies  Review of patient's allergies indicates no known allergies.  Home Medications   Prior to Admission medications   Medication Sig Start Date End Date Taking? Authorizing Provider  albuterol (PROVENTIL HFA;VENTOLIN HFA) 108 (90 BASE) MCG/ACT inhaler Inhale 2 puffs into the lungs every 6 (six) hours as needed. For shortness of breath    Historical Provider, MD  albuterol (PROVENTIL) (2.5 MG/3ML) 0.083% nebulizer solution Take 2.5 mg by nebulization every 6 (six) hours as needed. For shortness of breath    Historical Provider, MD  amLODipine (NORVASC) 10 MG tablet Take 10 mg by mouth daily.    Historical Provider, MD  HYDROcodone-ibuprofen (VICOPROFEN) 7.5-200 MG per tablet  Take 1 tablet by mouth every 8 (eight) hours as needed. For pain    Historical Provider, MD   BP 171/87 mmHg  Pulse 80  Temp(Src) 97.5 F (36.4 C) (Oral)  Resp 20  Ht 6' (1.829 m)  Wt 200 lb (90.719 kg)  BMI 27.12 kg/m2  SpO2 96% Physical Exam  Constitutional: He is oriented to person, place, and time. He appears well-developed and well-nourished.  HENT:  Head: Normocephalic and atraumatic.  Right Ear: External ear normal.  Left Ear: External ear normal.  Nose: Nose normal.  Eyes: Right eye exhibits no discharge. Left eye exhibits no discharge.  Neck: Neck supple.  Cardiovascular: Normal rate and intact distal pulses.   Pulses:      Radial pulses are 2+ on the right side.  Pulmonary/Chest: Effort normal.  Musculoskeletal: He exhibits no edema.       Right elbow: He exhibits decreased range of motion and swelling. He exhibits no deformity and no laceration. Tenderness found. Lateral epicondyle tenderness noted.  Normal strength and sensation in RUE, limited ROM of right elbow  Neurological: He is alert and oriented to person, place, and time.  Skin: Skin is warm and dry.  Nursing note and vitals reviewed.   ED Course  Procedures (including critical care time) Labs Review Labs Reviewed - No data to display  Imaging Review Dg Elbow Complete Right  08/28/2014   CLINICAL DATA:  Initial evaluation for right elbow pain after injury lifting a child 3 days ago, pain medially and  posteriorly  EXAM: RIGHT ELBOW - COMPLETE 3+ VIEW  COMPARISON:  None.  FINDINGS: No dislocation or effusion. Osteophytes seen medially. Small radial had osteophyte also noted. Bone fragment off of the olecranon process appears sclerotic measuring 9 mm, likely not of acute origin.  IMPRESSION: Advanced degenerative change about the elbow. Multiple osteophytes. No evidence of fracture or dislocation.   Electronically Signed   By: Esperanza Heir M.D.   On: 08/28/2014 12:09     EKG Interpretation None       MDM   Final diagnoses:  Elbow sprain, right, initial encounter    Xray with no fracture. Localized lateral swelling, no joint effusion. Likely strain/sprain from picking up grandchild. Given this is traumatic I doubt infection. Will recommend tylenol, ice and norco for severe pain. Follow up with PCP.     Audree Camel, MD 08/28/14 [phone]

## 2014-08-28 NOTE — Discharge Instructions (Signed)
Tennis Elbow Your caregiver has diagnosed you with a condition often referred to as "tennis elbow." This results from small tears or soreness (inflammation) at the start (origin) of the extensor muscles of the forearm. Although the condition is often called tennis or golfer's elbow, it is caused by any repetitive action performed by your elbow. HOME CARE INSTRUCTIONS  If the condition has been short lived, rest may be the only treatment required. Using your opposite hand or arm to perform the task may help. Even changing your grip may help rest the extremity. These may even prevent the condition from recurring.  Longer standing problems, however, will often be relieved faster by:  Using anti-inflammatory agents.  Applying ice packs for 30 minutes at the end of the working day, at bed time, or when activities are finished.  Your caregiver may also have you wear a splint or sling. This will allow the inflamed tendon to heal. At times, steroid injections aided with a local anesthetic will be required along with splinting for 1 to 2 weeks. Two to three steroid injections will often solve the problem. In some long standing cases, the inflamed tendon does not respond to conservative (non-surgical) therapy. Then surgery may be required to repair it. MAKE SURE YOU:   Understand these instructions.  Will watch your condition.  Will get help right away if you are not doing well or get worse. Document Released: 08/13/2005 Document Revised: 11/05/2011 Document Reviewed: 03/31/2008 [facility] Patient Information 2015 [place], [place]. This information is not intended to replace advice given to you by your health care provider. Make sure you discuss any questions you have with your health care provider.    Joint Sprain A sprain is a tear or stretch in the ligaments that hold a joint together. Severe sprains may need as long as 3-6 weeks of immobilization and/or exercises to heal completely. Sprained joints  should be rested and protected. If not, they can become unstable and prone to re-injury. Proper treatment can reduce your pain, shorten the period of disability, and reduce the risk of repeated injuries. TREATMENT   Rest and elevate the injured joint to reduce pain and swelling.  Apply ice packs to the injury for 20-30 minutes every 2-3 hours for the next 2-3 days.  Keep the injury wrapped in a compression bandage or splint as long as the joint is painful or as instructed by your caregiver.  Do not use the injured joint until it is completely healed to prevent re-injury and chronic instability. Follow the instructions of your caregiver.  Long-term sprain management may require exercises and/or treatment by a physical therapist. Taping or special braces may help stabilize the joint until it is completely better. SEEK MEDICAL CARE IF:   You develop increased pain or swelling of the joint.  You develop increasing redness and warmth of the joint.  You develop a fever.  It becomes stiff.  Your hand or foot gets cold or numb. Document Released: 09/20/2004 Document Revised: 11/05/2011 Document Reviewed: 08/30/2008 [facility] Patient Information 2015 [place], [place]. This information is not intended to replace advice given to you by your health care provider. Make sure you discuss any questions you have with your health care provider.

## 2014-08-28 NOTE — ED Notes (Signed)
Picked up a grandchild a few days ago and felt a pull, has been painful and swollen since

## 2015-01-11 ENCOUNTER — Encounter (HOSPITAL_BASED_OUTPATIENT_CLINIC_OR_DEPARTMENT_OTHER): Payer: Self-pay | Admitting: Emergency Medicine

## 2015-01-11 ENCOUNTER — Emergency Department (HOSPITAL_BASED_OUTPATIENT_CLINIC_OR_DEPARTMENT_OTHER)
Admission: EM | Admit: 2015-01-11 | Discharge: 2015-01-11 | Disposition: A | Discharge status: Home / Self Care | Payer: Self-pay | Attending: Emergency Medicine | Admitting: Emergency Medicine

## 2015-01-11 DIAGNOSIS — Z79899 Other long term (current) drug therapy: Secondary | ICD-10-CM | POA: Insufficient documentation

## 2015-01-11 DIAGNOSIS — Z720 Tobacco use: Secondary | ICD-10-CM | POA: Insufficient documentation

## 2015-01-11 DIAGNOSIS — Y9389 Activity, other specified: Secondary | ICD-10-CM | POA: Insufficient documentation

## 2015-01-11 DIAGNOSIS — Y998 Other external cause status: Secondary | ICD-10-CM | POA: Insufficient documentation

## 2015-01-11 DIAGNOSIS — T1502XA Foreign body in cornea, left eye, initial encounter: Secondary | ICD-10-CM | POA: Insufficient documentation

## 2015-01-11 DIAGNOSIS — Y92009 Unspecified place in unspecified non-institutional (private) residence as the place of occurrence of the external cause: Secondary | ICD-10-CM | POA: Insufficient documentation

## 2015-01-11 DIAGNOSIS — W228XXA Striking against or struck by other objects, initial encounter: Secondary | ICD-10-CM | POA: Insufficient documentation

## 2015-01-11 DIAGNOSIS — J449 Chronic obstructive pulmonary disease, unspecified: Secondary | ICD-10-CM | POA: Insufficient documentation

## 2015-01-11 MED ORDER — HYDROCODONE-ACETAMINOPHEN 5-325 MG PO TABS
1.0000 | ORAL_TABLET | Freq: Once | ORAL | Status: AC
  Administered 2015-01-11: 1 via ORAL
  Filled 2015-01-11: qty 1

## 2015-01-11 MED ORDER — FLUORESCEIN SODIUM 1 MG OP STRP
ORAL_STRIP | OPHTHALMIC | Status: AC
  Filled 2015-01-11: qty 1

## 2015-01-11 MED ORDER — ERYTHROMYCIN 5 MG/GM OP OINT
TOPICAL_OINTMENT | Freq: Four times a day (QID) | OPHTHALMIC | Status: DC
  Administered 2015-01-11: 05:00:00 via OPHTHALMIC
  Filled 2015-01-11: qty 3.5

## 2015-01-11 MED ORDER — TETRACAINE HCL 0.5 % OP SOLN
OPHTHALMIC | Status: AC
  Administered 2015-01-11: 05:00:00
  Filled 2015-01-11: qty 2

## 2015-01-11 MED ORDER — HYDROCODONE-ACETAMINOPHEN 5-325 MG PO TABS: 1.0000 | ORAL_TABLET | Freq: Four times a day (QID) | ORAL | Status: DC | PRN

## 2015-01-11 NOTE — Discharge Instructions (Signed)
Eye, Foreign Body The term foreign body refers to any object near, on the surface of or in the eye that should not be there. A foreign body may be a small speck of dirt or dust, a hair or eyelash, a splinter or any object. CAUSES  Foreign bodies can get in the eye by:  Flying pieces of something that was broken or destroyed (debris).  A sudden injury (trauma) to the eye. SYMPTOMS  Symptoms depend on what the foreign body is and where it is in the eye. The most common locations are:  On the inner surface of the upper or lower eyelids or on the covering of the white part of the eye (conjunctiva). Symptoms in this location are:  Irritating and painful, especially when blinking.  Feeling like something is in the eye.  On the surface of the clear covering on the front of the eye (cornea). A corneal foreign body has symptoms that:  Are painful and irritating since the cornea is very sensitive.  Form small "rust rings" around a metallic foreign body. Metallic foreign bodies stick more firmly to the surface of the cornea.  DIAGNOSIS  Foreign bodies are found during an exam by an eye specialist. Those that are on the eyelids, conjunctiva or cornea are usually (but not always) easily found. When a foreign body is inside the eyeball, a cataract may form almost right away. This makes it hard for an ophthalmologist to find the foreign body. Special tests may be needed, including ultrasound testing, X-rays and CT scans. TREATMENT   Foreign bodies that are on the eyelids, conjunctiva or cornea are often removed easily and painlessly.  If the foreign body has caused a scratch or abrasion of the cornea, antibiotic drops, ointments and/or a tight patch called a "pressure patch" may be needed. Follow-up exams will be needed for several days until the abrasion heals.  Surgery is needed right away if the foreign body is inside the eyeball. This is a medical emergency. An antibiotic therapy will likely be  given to stop an infection. HOME CARE INSTRUCTIONS  The use of eye patches is not universal. Their use varies from state to state and from caregiver to caregiver. If an eye patch was applied:  Keep the eye patch on for as long as directed by your caregiver until the follow-up appointment.  Do not remove the patch to put in medications unless instructed to do so. When replacing the patch, retape it as it was before. Follow the same procedure if the patch becomes loose.  WARNING: Do not drive or operate machinery while the eye is patched. The ability to judge distances will be impaired.  Only take over-the-counter or prescription medicines for pain, discomfort or fever as directed by the caregiver. If no eye patch was applied:  Keep the eye closed as much as possible. Do not rub the eye.  Wear dark glasses as needed to protect the eyes from bright light.  Do not wear contact lenses until the eye feels normal again, or as instructed.  Wear protective eye covering if there is a risk of eye injury. This is important when working with high speed tools.  Only take over-the-counter or prescription medicines for pain, discomfort or fever as directed by the caregiver. SEEK IMMEDIATE MEDICAL CARE IF:   Pain increases in the eye or the vision changes.  You or your child has problems with the eye patch.  The injury to the eye appears to be getting larger.  There is discharge from the injured eye.  Swelling and/or soreness (inflammation) develops around the affected eye.  MAKE SURE YOU:   Understand these instructions.  Will watch your condition.  Will get help right away if you are not doing well or get worse. Document Released: 08/13/2005 Document Revised: 11/05/2011 Document Reviewed: 01/08/2013 [facility] Patient Information 2015 [place]. This information is not intended to replace advice given to you by your health care provider. Make sure you discuss any questions you have  with your health care provider.

## 2015-01-11 NOTE — ED Notes (Signed)
Patient reports that he got something in his left eye earlier today

## 2015-01-11 NOTE — ED Provider Notes (Signed)
CSN: [id number]     Arrival date & time 01/11/15  0344 History   First MD Initiated Contact with Patient 01/11/15 0422     Chief Complaint  Patient presents with  . 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 He was wearing safety goggles while grinding some metal. Despite wearing goggles he felt something strike his left eye. He irrigated profusely at the time but continues to have a persistent foreign body sensation of the left eye. He has difficulty describing the pain states it feels like there is a film over his eye. There is some mild blurred vision on the left. His pain is moderate to severe. It is somewhat worse with exposure to light.  Past Medical History  Diagnosis Date  . COPD (chronic obstructive pulmonary disease)   . Asthma    Past Surgical History  Procedure Laterality Date  . Knee arthroscopy w/ acl reconstruction     History reviewed. No pertinent family history. History  Substance Use Topics  . Smoking status: Current Every Day Smoker  . Smokeless tobacco: Not on file  . Alcohol Use: No    Review of Systems  All other systems reviewed and are negative.   Allergies  Review of patient's allergies indicates no known allergies.  Home Medications   Prior to Admission medications   Medication Sig Start Date End Date Taking? Authorizing Provider  albuterol (PROVENTIL HFA;VENTOLIN HFA) 108 (90 BASE) MCG/ACT inhaler Inhale 2 puffs into the lungs every 6 (six) hours as needed. For shortness of breath    Historical Provider, MD  albuterol (PROVENTIL) (2.5 MG/3ML) 0.083% nebulizer solution Take 2.5 mg by nebulization every 6 (six) hours as needed. For shortness of breath    Historical Provider, MD  amLODipine (NORVASC) 10 MG tablet Take 10 mg by mouth daily.    Historical Provider, MD   HYDROcodone-acetaminophen (NORCO) 5-325 MG per tablet Take 1 tablet by mouth every 6 (six) hours as needed for severe pain. 08/28/14   Pricilla LovelessScott Goldston, MD   BP 163/95 mmHg  Pulse 69  Temp(Src) 98.3 F (36.8 C) (Oral)  Resp 20  Ht 6' (1.829 m)  Wt 195 lb (88.451 kg)  BMI 26.44 kg/m2  SpO2 100%   Physical Exam  General: Well-developed, well-nourished male in no acute distress; appearance consistent with age of record HENT: normocephalic; atraumatic Eyes: pupils equal, round and reactive to light; extraocular muscles intact; no conjunctival injection; no hyphema; small metallic corneal foreign body seen just to the right of the pupil Neck: supple Heart: regular rate and rhythm Lungs: Normal respiratory effort and excursion Abdomen: soft; nondistended Extremities: No deformity; full range of motion Neurologic: Awake, alert and oriented; motor function intact in all extremities and symmetric; no facial droop Skin: Warm and dry Psychiatric: Normal mood and affect    ED Course  Procedures (including critical care time)  FOREIGN BODY REMOVAL The patient's left eye was anesthetized with several drops of tetracaine solution. The foreign object was identified on slit-lamp and was removed using an 18-gauge needle and a cotton swab. A rust spot was noted to remain. The patient tolerated this well and there were no immediate complications.  MDM  4:41 AM We will treat with erythromycin ointment and analgesics and have him follow-up with ophthalmology later today for removal of the rust spot.   Paula LibraJohn Djimon Lundstrom, MD 01/11/15 [id number]

## 2015-03-13 ENCOUNTER — Emergency Department (INDEPENDENT_AMBULATORY_CARE_PROVIDER_SITE_OTHER)
Admission: EM | Admit: 2015-03-13 | Discharge: 2015-03-13 | Disposition: A | Discharge status: Home / Self Care | Payer: Self-pay | Source: Home / Self Care | Attending: Emergency Medicine | Admitting: Emergency Medicine

## 2015-03-13 ENCOUNTER — Encounter (HOSPITAL_COMMUNITY): Payer: Self-pay | Admitting: *Deleted

## 2015-03-13 ENCOUNTER — Emergency Department (INDEPENDENT_AMBULATORY_CARE_PROVIDER_SITE_OTHER): Payer: Self-pay

## 2015-03-13 DIAGNOSIS — R590 Localized enlarged lymph nodes: Secondary | ICD-10-CM

## 2015-03-13 DIAGNOSIS — M7989 Other specified soft tissue disorders: Secondary | ICD-10-CM

## 2015-03-13 LAB — POCT I-STAT, CHEM 8
BUN: 22 mg/dL — AB (ref 6–20)
CHLORIDE: 105 mmol/L (ref 101–111)
Calcium, Ion: 1.23 mmol/L (ref 1.12–1.23)
Creatinine, Ser: 1.1 mg/dL (ref 0.61–1.24)
Glucose, Bld: 93 mg/dL (ref 65–99)
HEMATOCRIT: 45 % (ref 39.0–52.0)
Hemoglobin: 15.3 g/dL (ref 13.0–17.0)
Potassium: 4.2 mmol/L (ref 3.5–5.1)
Sodium: 142 mmol/L (ref 135–145)
TCO2: 23 mmol/L (ref 0–100)

## 2015-03-13 LAB — POCT URINALYSIS DIP (DEVICE)
Bilirubin Urine: NEGATIVE
Glucose, UA: NEGATIVE mg/dL
Hgb urine dipstick: NEGATIVE
KETONES UR: NEGATIVE mg/dL
LEUKOCYTES UA: NEGATIVE
Nitrite: NEGATIVE
PH: 5.5 (ref 5.0–8.0)
Protein, ur: NEGATIVE mg/dL
Specific Gravity, Urine: >=1.03 (ref 1.005–1.030)
Urobilinogen, UA: 0.2 mg/dL (ref 0.0–1.0)

## 2015-03-13 MED ORDER — PREDNISONE 50 MG PO TABS: ORAL_TABLET | ORAL | Status: DC

## 2015-03-13 NOTE — ED Provider Notes (Signed)
CSN: [id number]     Arrival date & time 03/13/15  1742 History   First MD Initiated Contact with Patient 03/13/15 1746     Chief Complaint  Patient presents with  . Leg Swelling   (Consider location/radiation/quality/duration/timing/severity/associated sxs/prior Treatment) HPI He is a 59 year old man here for evaluation of leg swelling. He states this is been a problem over the last 2-3 months. He reports swelling in bilateral legs and feet, but significantly worse on the right side. It will improve with elevation. He describes pitting edema at the end of his workday. He also reports some swelling in his hands. When his right foot and leg swells up, he will have pain across the ankle joint. He denies any cough or shortness of breath. No orthopnea. He is a former smoker, he quit about 6 weeks ago.  He also states he has a knot under his left armpit. He is not sure how long it has been there, but he just noticed it recently.  Past Medical History  Diagnosis Date  . COPD (chronic obstructive pulmonary disease)   . Asthma    Past Surgical History  Procedure Laterality Date  . Knee arthroscopy w/ acl reconstruction     No family history on file. History  Substance Use Topics  . Smoking status: Former Smoker    Quit date: 01/12/2015  . Smokeless tobacco: Not on file  . Alcohol Use: No    Review of Systems As in history of present illness Allergies  Review of patient's allergies indicates no known allergies.  Home Medications   Prior to Admission medications   Medication Sig Start Date End Date Taking? Authorizing Provider  albuterol (PROVENTIL HFA;VENTOLIN HFA) 108 (90 BASE) MCG/ACT inhaler Inhale 2 puffs into the lungs every 6 (six) hours as needed. For shortness of breath    Historical Provider, MD  albuterol (PROVENTIL) (2.5 MG/3ML) 0.083% nebulizer solution Take 2.5 mg by nebulization every 6 (six) hours as needed. For shortness of breath    Historical Provider, MD  predniSONE  (DELTASONE) 50 MG tablet Take 1 pill daily for 5 days. 03/13/15   Charm Rings, MD   BP 130/81 mmHg  Pulse 64  Temp(Src) 98.5 F (36.9 C) (Oral)  Resp 18  SpO2 96% Physical Exam  Constitutional: He is oriented to person, place, and time. He appears well-developed and well-nourished. No distress.  Neck: Neck supple.  Cardiovascular: Normal rate, regular rhythm, normal heart sounds and intact distal pulses.  Exam reveals no gallop.   No murmur heard. Pulmonary/Chest: Effort normal and breath sounds normal. No respiratory distress. He has no wheezes. He has no rales.  Musculoskeletal: He exhibits no tenderness.  Trace pitting edema in bilateral lower legs. No calf tenderness. 1+ DP pulses bilaterally. Right ankle: No erythema or edema. No point tenderness. No joint laxity.  Lymphadenopathy:    He has axillary adenopathy.       Right axillary: No pectoral and no lateral adenopathy present.       Left axillary: Pectoral adenopathy present. No lateral adenopathy present. Neurological: He is alert and oriented to person, place, and time.    ED Course  Procedures (including critical care time) Labs Review Labs Reviewed  POCT I-STAT, CHEM 8 - Abnormal; Notable for the following:    BUN 22 (*)    All other components within normal limits  POCT URINALYSIS DIP (DEVICE)    Imaging Review Dg Chest 2 View  03/13/2015   CLINICAL DATA:  Swelling the  legs and feet as well as hands for 3 months, history COPD, asthma, and smoking  EXAM: CHEST  2 VIEW  COMPARISON:  04/09/2012  FINDINGS: Normal heart size, mediastinal contours, and pulmonary vascularity.  Emphysematous and bronchitic changes consistent with COPD.  No acute infiltrate, pleural effusion or pneumothorax.  Multilevel endplate spur formation thoracic spine.  IMPRESSION: Emphysematous and bronchitic changes consistent with COPD.  No acute infiltrate.   Electronically Signed   By: Ulyses SouthwardMark  Boles M.D.   On: 03/13/2015 18:34     MDM   1.  Swelling of both lower extremities   2. Axillary lymphadenopathy    Swelling is likely due to venous insufficiency. Recommended compression stockings. He does have a history of gout, which may be responsible for his ankle pain. Will treat with a five-day course of prednisone. Recommended watchful waiting of the axillary lymph node for the time being. With his smoking history, he likely qualifies for CT scan to screen for lung cancer. They will call the office tomorrow [facility].    Charm RingsErin J Dago Jungwirth, MD 03/13/15 1910

## 2015-03-13 NOTE — ED Notes (Signed)
C/O bilat lower leg and bilat hand swelling over past couple months with intermittent pain.  Spouse describes pitting edema, which is improved today since "he's been off his feet since yesterday".  Has not seen a doctor in several years.

## 2015-03-13 NOTE — Discharge Instructions (Signed)
The swelling is likely from being on your feet all day. Get a pair of compression socks to wear during the day. The pain in your ankle may be from low-grade gout. Take prednisone daily for 5 days. Keep an eye on the lymph node in your armpit. If you are getting more bumps or it is getting larger, please come back. Keep working on finding a primary care doctor. You can call our office at [phone] tomorrow afternoon as [facility] see if you qualify for the orange card.

## 2016-05-21 ENCOUNTER — Ambulatory Visit (INDEPENDENT_AMBULATORY_CARE_PROVIDER_SITE_OTHER): Payer: Self-pay

## 2016-05-21 ENCOUNTER — Encounter (HOSPITAL_COMMUNITY): Payer: Self-pay | Admitting: Emergency Medicine

## 2016-05-21 ENCOUNTER — Ambulatory Visit (HOSPITAL_COMMUNITY)
Admission: EM | Admit: 2016-05-21 | Discharge: 2016-05-21 | Disposition: A | Discharge status: Home / Self Care | Payer: Self-pay | Attending: Family Medicine | Admitting: Family Medicine

## 2016-05-21 DIAGNOSIS — M25571 Pain in right ankle and joints of right foot: Secondary | ICD-10-CM

## 2016-05-21 DIAGNOSIS — M129 Arthropathy, unspecified: Secondary | ICD-10-CM

## 2016-05-21 DIAGNOSIS — M19071 Primary osteoarthritis, right ankle and foot: Secondary | ICD-10-CM

## 2016-05-21 MED ORDER — PREDNISONE 20 MG PO TABS: ORAL_TABLET | ORAL | 0 refills | Status: DC

## 2016-05-21 NOTE — ED Provider Notes (Signed)
[facility]    CSN: [id number] Arrival date & time: 05/21/16  1753  First Provider Contact:  None       History   Chief Complaint Chief Complaint  Patient presents with  . Ankle Pain    HPI Isaiah Wells is a 60 y.o. male.   Is a 60 year old gentleman with 2 days of right ankle swelling and discomfort. He has a history of gout, but this is not as painful some of his gout attacks in the past.  Patient denies any recent skin breaks, puncture wounds, trauma to the foot.  Patient is seen with his wife thinks he has an arthritis condition. Patient does not have any insurance and would like to defer testing to when he has insurance.  Patient notes chronic stiffness in his fingers and hands. He does not have ankle swelling or problems with his left foot.      Past Medical History:  Diagnosis Date  . Asthma   . COPD (chronic obstructive pulmonary disease) (HCC)     There are no active problems to display for this patient.   Past Surgical History:  Procedure Laterality Date  . KNEE ARTHROSCOPY W/ ACL RECONSTRUCTION         Home Medications    Prior to Admission medications   Medication Sig Start Date End Date Taking? Authorizing Provider  albuterol (PROVENTIL HFA;VENTOLIN HFA) 108 (90 BASE) MCG/ACT inhaler Inhale 2 puffs into the lungs every 6 (six) hours as needed. For shortness of breath    Historical Provider, MD  albuterol (PROVENTIL) (2.5 MG/3ML) 0.083% nebulizer solution Take 2.5 mg by nebulization every 6 (six) hours as needed. For shortness of breath    Historical Provider, MD  predniSONE (DELTASONE) 20 MG tablet Two daily with food for 5 days, then 1 daily for 5 days.  Always follow with large glass of liquid 05/21/16   Elvina Sidle, MD    Family History No family history on file.  Social History Social History  Substance Use Topics  . Smoking status: Current Every Day Smoker    Last attempt to quit: 01/12/2015  . Smokeless tobacco:  Never Used  . Alcohol use No     Allergies   Review of patient's allergies indicates no known allergies.   Review of Systems Review of Systems  Constitutional: Negative.   HENT: Negative.   Eyes: Negative.   Musculoskeletal: Positive for arthralgias.     Physical Exam Triage Vital Signs ED Triage Vitals [05/21/16 1838]  Enc Vitals Group     BP 154/95     Pulse Rate 60     Resp 16     Temp 97.9 F (36.6 C)     Temp Source Oral     SpO2 95 %     Weight      Height      Head Circumference      Peak Flow      Pain Score      Pain Loc      Pain Edu?      Excl. in GC?    No data found.   Updated Vital Signs BP 154/95 (BP Location: Right Arm)   Pulse 60   Temp 97.9 F (36.6 C) (Oral)   Resp 16   SpO2 95%       Physical Exam  Constitutional: He is oriented to person, place, and time. He appears well-developed and well-nourished.  HENT:  Head: Normocephalic.  Left Ear: External ear  normal.  Mouth/Throat: Oropharynx is clear and moist.  Eyes: Conjunctivae and EOM are normal. Pupils are equal, round, and reactive to light.  Neck: Normal range of motion. Neck supple.  Musculoskeletal:  Marked diffuse swelling of the right ankle. He also has swelling of the first MTP joint with some tenderness and erythema overlying.  Neurological: He is alert and oriented to person, place, and time.  Nursing note and vitals reviewed.    UC Treatments / Results  Labs (all labs ordered are listed, but only abnormal results are displayed) Labs Reviewed - No data to display  EKG  EKG Interpretation None       Radiology No results found. [facility] reading (PRIMARY) by  Dr. 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   Procedures Procedures (including critical care time)  Medications Ordered in UC Medications - No data to display   Initial Impression / Assessment and Plan / UC Course  I have reviewed the triage  vital signs and the nursing notes.  Pertinent labs & imaging results that were available during my care of the patient were reviewed by me and considered in my medical decision making (see chart for details).  Clinical Course      Final Clinical Impressions(s) / UC Diagnoses   Final diagnoses:  Ankle pain, right  Arthritis of right ankle    New Prescriptions New Prescriptions   PREDNISONE (DELTASONE) 20 MG TABLET    Two daily with food for 5 days, then 1 daily for 5 days.  Always follow with large glass of liquid  follow up with PCP in one week   Elvina SidleKurt Naomia Lenderman, MD 05/21/16 1925

## 2016-05-21 NOTE — Discharge Instructions (Signed)
You need to have arthritis tests done. I am deferring doing these tests until you have insurance. You should see improvement in the ankle in the next 24-48 hours or follow-up with your primary care doctor or return here.

## 2016-05-21 NOTE — ED Triage Notes (Signed)
Patient reports right ankle swelling and pain, intermittent.  Patient reports no known injury

## 2016-05-21 NOTE — ED Notes (Signed)
Assisted patient with wheelchair to the car

## 2016-08-08 ENCOUNTER — Ambulatory Visit (HOSPITAL_COMMUNITY)
Admission: EM | Admit: 2016-08-08 | Discharge: 2016-08-08 | Disposition: A | Discharge status: Home / Self Care | Payer: Self-pay | Attending: Family Medicine | Admitting: Family Medicine

## 2016-08-08 ENCOUNTER — Encounter (HOSPITAL_COMMUNITY): Payer: Self-pay | Admitting: Emergency Medicine

## 2016-08-08 DIAGNOSIS — L245 Irritant contact dermatitis due to other chemical products: Secondary | ICD-10-CM

## 2016-08-08 MED ORDER — METHYLPREDNISOLONE ACETATE 80 MG/ML IJ SUSP
INTRAMUSCULAR | Status: AC
  Filled 2016-08-08: qty 1

## 2016-08-08 MED ORDER — PREDNISONE 50 MG PO TABS: ORAL_TABLET | ORAL | 0 refills | Status: DC

## 2016-08-08 MED ORDER — TRIAMCINOLONE ACETONIDE 40 MG/ML IJ SUSP
INTRAMUSCULAR | Status: AC
  Filled 2016-08-08: qty 1

## 2016-08-08 MED ORDER — HYDROXYZINE HCL 25 MG PO TABS: 25.0000 mg | ORAL_TABLET | Freq: Four times a day (QID) | ORAL | 1 refills | Status: AC

## 2016-08-08 MED ORDER — METHYLPREDNISOLONE ACETATE 40 MG/ML IJ SUSP
80.0000 mg | Freq: Once | INTRAMUSCULAR | Status: AC
  Administered 2016-08-08: 80 mg via INTRAMUSCULAR

## 2016-08-08 MED ORDER — TRIAMCINOLONE ACETONIDE 40 MG/ML IJ SUSP
40.0000 mg | Freq: Once | INTRAMUSCULAR | Status: AC
  Administered 2016-08-08: 40 mg via INTRAMUSCULAR

## 2016-08-08 MED ORDER — MOMETASONE FUROATE 0.1 % EX SOLN: Freq: Every day | CUTANEOUS | 1 refills | Status: DC

## 2016-08-08 NOTE — ED Provider Notes (Signed)
[facility]    CSN: [id number] Arrival date & time: 08/08/16  1355     History   Chief Complaint Chief Complaint  Patient presents with  . Rash    HPI Isaiah Wells is a 60 y.o. male.   The history is provided by the patient and the spouse.  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 Relieved by:  Nothing Worsened by:  Nothing Ineffective treatments:  None tried   Past Medical History:  Diagnosis Date  . Asthma   . COPD (chronic obstructive pulmonary disease) (HCC)     There are no active problems to display for this patient.   Past Surgical History:  Procedure Laterality Date  . KNEE ARTHROSCOPY W/ ACL RECONSTRUCTION         Home Medications    Prior to Admission medications   Medication Sig Start Date End Date Taking? Authorizing Provider  albuterol (PROVENTIL HFA;VENTOLIN HFA) 108 (90 BASE) MCG/ACT inhaler Inhale 2 puffs into the lungs every 6 (six) hours as needed. For shortness of breath    Historical Provider, MD  albuterol (PROVENTIL) (2.5 MG/3ML) 0.083% nebulizer solution Take 2.5 mg by nebulization every 6 (six) hours as needed. For shortness of breath    Historical Provider, MD  predniSONE (DELTASONE) 20 MG tablet Two daily with food for 5 days, then 1 daily for 5 days.  Always follow with large glass of liquid 05/21/16   Elvina SidleKurt Lauenstein, MD    Family History History reviewed. No pertinent family history.  Social History Social History  Substance Use Topics  . Smoking status: Current Every Day Smoker    Packs/day: 1.00    Types: Cigarettes  . Smokeless tobacco: Never Used  . Alcohol use No     Allergies   Patient has no known allergies.   Review of Systems Review of Systems  Constitutional: Negative.   HENT: Negative.   Respiratory:  Negative.   Cardiovascular: Negative.   Musculoskeletal: Negative.   Skin: Positive for rash. Negative for wound.  Neurological: Negative.   All other systems reviewed and are negative.    Physical Exam Triage Vital Signs ED Triage Vitals [08/08/16 1421]  Enc Vitals Group     BP 150/74     Pulse Rate 70     Resp      Temp 97.9 F (36.6 C)     Temp Source Oral     SpO2 97 %     Weight      Height      Head Circumference      Peak Flow      Pain Score 3     Pain Loc      Pain Edu?      Excl. in GC?    No data found.   Updated Vital Signs BP 150/74 (BP Location: Left Arm)   Pulse 70   Temp 97.9 F (36.6 C) (Oral)   SpO2 97%   Visual Acuity Right Eye Distance:   Left Eye Distance:   Bilateral Distance:    Right Eye Near:   Left Eye Near:    Bilateral Near:     Physical Exam  Constitutional: He is oriented to person, place, and time. He appears well-developed and well-nourished. No distress.  HENT:  Mouth/Throat: Oropharynx is clear and  moist.  Pulmonary/Chest: He has no wheezes.  Neurological: He is alert and oriented to person, place, and time.  Skin: Skin is warm and dry. Rash noted. There is erythema.  Erythematous crusting papular1-242mm lesions over body.nonpustular , nonvesicular.  Nursing note and vitals reviewed.    UC Treatments / Results  Labs (all labs ordered are listed, but only abnormal results are displayed) Labs Reviewed - No data to display  EKG  EKG Interpretation None       Radiology No results found.  Procedures Procedures (including critical care time)  Medications Ordered in UC Medications  triamcinolone acetonide (KENALOG-40) injection 40 mg (not administered)  methylPREDNISolone acetate (DEPO-MEDROL) injection 80 mg (not administered)     Initial Impression / Assessment and Plan / UC Course  I have reviewed the triage vital signs and the nursing notes.  Pertinent labs & imaging results that were available during  my care of the patient were reviewed by me and considered in my medical decision making (see chart for details).  Clinical Course       Final Clinical Impressions(s) / UC Diagnoses   Final diagnoses:  None    New Prescriptions New Prescriptions   No medications on file     Linna HoffJames D Zorina Mallin, MD 08/08/16 [phone]

## 2016-08-08 NOTE — ED Triage Notes (Signed)
Pt was working with insulation a week ago.  He has since developed a red dot rash all over his torso, arms, and legs.  It is painful and itchy.  He denies any fever.

## 2017-12-15 ENCOUNTER — Emergency Department (HOSPITAL_COMMUNITY)
Admission: EM | Admit: 2017-12-15 | Discharge: 2017-12-15 | Disposition: A | Discharge status: Home / Self Care | Payer: Self-pay | Attending: Emergency Medicine | Admitting: Emergency Medicine

## 2017-12-15 ENCOUNTER — Encounter (HOSPITAL_COMMUNITY): Payer: Self-pay | Admitting: Emergency Medicine

## 2017-12-15 ENCOUNTER — Emergency Department (HOSPITAL_COMMUNITY): Payer: Self-pay

## 2017-12-15 ENCOUNTER — Other Ambulatory Visit: Payer: Self-pay

## 2017-12-15 DIAGNOSIS — M79604 Pain in right leg: Secondary | ICD-10-CM | POA: Insufficient documentation

## 2017-12-15 DIAGNOSIS — M79671 Pain in right foot: Secondary | ICD-10-CM

## 2017-12-15 DIAGNOSIS — F17210 Nicotine dependence, cigarettes, uncomplicated: Secondary | ICD-10-CM | POA: Insufficient documentation

## 2017-12-15 DIAGNOSIS — M25471 Effusion, right ankle: Secondary | ICD-10-CM

## 2017-12-15 DIAGNOSIS — J45909 Unspecified asthma, uncomplicated: Secondary | ICD-10-CM | POA: Insufficient documentation

## 2017-12-15 DIAGNOSIS — M25571 Pain in right ankle and joints of right foot: Secondary | ICD-10-CM

## 2017-12-15 LAB — BASIC METABOLIC PANEL
ANION GAP: 7 (ref 5–15)
BUN: 15 mg/dL (ref 6–20)
CALCIUM: 9 mg/dL (ref 8.9–10.3)
CO2: 23 mmol/L (ref 22–32)
Chloride: 107 mmol/L (ref 101–111)
Creatinine, Ser: 0.78 mg/dL (ref 0.61–1.24)
GFR calc non Af Amer: >60 mL/min (ref >60)
Glucose, Bld: 115 mg/dL — ABNORMAL HIGH (ref 65–99)
Potassium: 4 mmol/L (ref 3.5–5.1)
Sodium: 137 mmol/L (ref 135–145)

## 2017-12-15 LAB — SEDIMENTATION RATE: Sed Rate: 4 mm/hr (ref 0–16)

## 2017-12-15 LAB — CBC WITH DIFFERENTIAL/PLATELET
BASOS ABS: 0 10*3/uL (ref 0.0–0.1)
Basophils Relative: 0 %
Eosinophils Absolute: 0.3 10*3/uL (ref 0.0–0.7)
Eosinophils Relative: 4 %
HEMATOCRIT: 43.9 % (ref 39.0–52.0)
Hemoglobin: 14.9 g/dL (ref 13.0–17.0)
LYMPHS PCT: 39 %
Lymphs Abs: 2.7 10*3/uL (ref 0.7–4.0)
MCH: 30.2 pg (ref 26.0–34.0)
MCHC: 33.9 g/dL (ref 30.0–36.0)
MCV: 89 fL (ref 78.0–100.0)
MONO ABS: 0.4 10*3/uL (ref 0.1–1.0)
Monocytes Relative: 6 %
NEUTROS ABS: 3.5 10*3/uL (ref 1.7–7.7)
Neutrophils Relative %: 51 %
Platelets: 220 10*3/uL (ref 150–400)
RBC: 4.93 MIL/uL (ref 4.22–5.81)
RDW: 12.9 % (ref 11.5–15.5)
WBC: 7 10*3/uL (ref 4.0–10.5)

## 2017-12-15 LAB — I-STAT CG4 LACTIC ACID, ED
LACTIC ACID, VENOUS: 1.32 mmol/L (ref 0.5–1.9)
Lactic Acid, Venous: 1.18 mmol/L (ref 0.5–1.9)

## 2017-12-15 LAB — C-REACTIVE PROTEIN

## 2017-12-15 NOTE — ED Notes (Signed)
Patient transported to MRI 

## 2017-12-15 NOTE — Discharge Instructions (Addendum)
Please wear the boot when you are up and walking until you are seen by Dr. Victorino DikeHewitt for follow-up.   Call [facility].  They can serve as your primary care provider.  Sometimes there is a wait before you can be seen.  They also have assistance at their office to help with applying for Medicaid for the [facility] [place] [facility].  Call their office for additional information.  Take 650 mg of Tylenol once every 6 hours or 600 mg of ibuprofen with food.  It is important to take ibuprofen with food to avoid upsetting your stomach.  You can also alternate between these 2 medications and take 1 dose of each every 3 hours.  Please avoid taking other medications such as Goody powder, aspirin, Motrin, naproxen, or other NSAIDs while you are following this regimen.  Apply ice for 15 to 20 minutes up to 3-4 times a day to help with swelling.  Elevate your leg above the level of your heart to improve swelling.  If you develop new or worsening symptoms including if the foot or ankle becomes red and hot to the touch, if you develop fever chills, have any fall or injury, or develop other new concerning symptoms, please return to the emergency department for re-evaluation.    To find a primary care or specialty doctor please call [phone] or [phone] to access "[facility] Find a Doctor Service."  You may also go on the [facility] website at [email or web address]  There are also multiple [facility] practices throughout the [place] that are frequently accepting new patients. You may find a clinic that is close to your home and contact them.  [facility] - [address] [id number] [phone]  [facility] in [place] (also locations in [place] and [place]) - [address] [place] [phone]  [facility] - [address] [place] [phone]

## 2017-12-15 NOTE — Progress Notes (Signed)
Orthopedic Tech Progress Note Patient Details:  Isaiah Wells 10-Feb-1956 [id number]  Ortho Devices Type of Ortho Device: CAM walker Ortho Device/Splint Location: rle Ortho Device/Splint Interventions: Ordered, Application, Adjustment   Post Interventions Patient Tolerated: Well Instructions Provided: Care of device, Adjustment of device   Trinna PostMartinez, Glennys Schorsch J 12/15/2017, 9:38 PM

## 2017-12-15 NOTE — ED Triage Notes (Signed)
Patient presents to ED for assessment of right ankle swelling and foot pain.  Patient states it occurs daily, mostly after he's been up walking on it.  Significant pitting edema to ankle noted.  Pt denies known injury.

## 2017-12-15 NOTE — ED Provider Notes (Signed)
[facility] EMERGENCY DEPARTMENT Provider Note   CSN: [id number] Arrival date & time: 12/15/17  1138     History   Chief Complaint Chief Complaint  Patient presents with  . Leg Swelling    HPI Isaiah Wells is a 62 y.o. male.  The history is provided by the patient and medical records. No language interpreter was used.   Isaiah Wells is a 62 y.o. male  with a PMH of COPD who presents to the Emergency Department complaining of swelling and pain to right lower extremity. Patient reports that he has been struggling with swelling to the lower right leg and right ankle for over a year.  When he gets home from work, it is typically much more swollen.  He is not followed up with a primary care doctor for this.  Yesterday, he noticed much more pain than usual.  Swelling is still about the same.  This morning, the pain worsened.  Pain is worse with movement of the ankle as well as ambulation.  He denies any known injury or trauma.  He has broken the ankle before, but reports it was over 40 years ago and has not had any similar experiences to this in the past.  He also notes history of gout, however reports this is much different than his usual gout flares.  Denies any known skin changes or open wounds.  He has taken over-the-counter pain medication with some relief. No fever / chills.    Past Medical History:  Diagnosis Date  . Asthma   . COPD (chronic obstructive pulmonary disease) (HCC)     There are no active problems to display for this patient.   Past Surgical History:  Procedure Laterality Date  . KNEE ARTHROSCOPY W/ ACL RECONSTRUCTION          Home Medications    Prior to Admission medications   Medication Sig Start Date End Date Taking? Authorizing Provider  albuterol (PROVENTIL HFA;VENTOLIN HFA) 108 (90 BASE) MCG/ACT inhaler Inhale 2 puffs into the lungs every 6 (six) hours as needed. For shortness of breath   Yes [provider]    albuterol (PROVENTIL) (2.5 MG/3ML) 0.083% nebulizer solution Take 2.5 mg by nebulization every 6 (six) hours as needed. For shortness of breath   Yes [provider]  Aspirin-Salicylamide-Caffeine (BC HEADACHE POWDER PO) Take 1 each by mouth daily as needed (headache).   Yes [provider]  fexofenadine (ALLEGRA) 180 MG tablet Take 180 mg by mouth daily as needed for allergies or rhinitis.   Yes [provider]  hydrOXYzine (ATARAX/VISTARIL) 25 MG tablet Take 1 tablet (25 mg total) by mouth every 6 (six) hours. Patient not taking: Reported on 12/15/2017 08/08/16   Linna Hoff, MD  mometasone (ELOCON) 0.1 % lotion Apply topically daily. Patient not taking: Reported on 12/15/2017 08/08/16   Linna Hoff, MD  predniSONE (DELTASONE) 50 MG tablet 1 tab daily for 2 days then 1/2 tab daily for 2 days. Patient not taking: Reported on 12/15/2017 08/08/16   Linna Hoff, MD    Family History History reviewed. No pertinent family history.  Social History Social History   Tobacco Use  . Smoking status: Current Every Day Smoker    Packs/day: 1.00    Types: Cigarettes  . Smokeless tobacco: Never Used  Substance Use Topics  . Alcohol use: No  . Drug use: No     Allergies   Patient has no known allergies.  Review of Systems Review of Systems  Constitutional: Negative for chills and fever.  Cardiovascular: Positive for leg swelling. Negative for chest pain and palpitations.  Musculoskeletal: Positive for arthralgias and myalgias.  Skin: Negative for color change and wound.  All other systems reviewed and are negative.    Physical Exam Updated Vital Signs BP (!) 162/96   Pulse (!) 55   Temp 97.7 F (36.5 C)   Resp 17   SpO2 98%   Physical Exam  Constitutional: He is oriented to person, place, and time. He appears well-developed and well-nourished. No distress.  HENT:  Head: Normocephalic and atraumatic.  Cardiovascular: Normal rate, regular  rhythm and normal heart sounds.  No murmur heard. Pulmonary/Chest: Effort normal and breath sounds normal. No respiratory distress.  Musculoskeletal:  2+ edema to right lower extremity and ankle. Ankle with full ROM, but this does reproduce his pain. 2+ DP. Sensation intact throughout.  Neurological: He is alert and oriented to person, place, and time.  Skin: Skin is warm and dry.  Right ankle warm to the touch, but no erythema noted. Small healing abrasion to central anterior distal RLE without surrounding erythema and no tenderness to palpation.  Nursing note and vitals reviewed.    ED Treatments / Results  Labs (all labs ordered are listed, but only abnormal results are displayed) Labs Reviewed  BASIC METABOLIC PANEL - Abnormal; Notable for the following components:      Result Value   Glucose, Bld 115 (*)    All other components within normal limits  CBC WITH DIFFERENTIAL/PLATELET  I-STAT CG4 LACTIC ACID, ED    EKG None  Radiology Dg Ankle Complete Right  Result Date: 12/15/2017 CLINICAL DATA:  RIGHT ankle swelling and foot pain. Pain daily, especially after walking on it. Pitting edema of the ankle. No known injury. EXAM: RIGHT ANKLE - COMPLETE 3+ VIEW COMPARISON:  A 01/14/2016 FINDINGS: There is significant, diffuse edema. Degenerative changes are identified in the LATERAL ankle. There focal areas of lucency and sclerosis within the distal fibula, consistent with prior injury or even chronic osteomyelitis. No evidence for cortical destruction. IMPRESSION: 1. Significant soft tissue swelling. 2. Progressive erosive changes in the distal fibula. Osteomyelitis should be considered. Electronically Signed   By: Norva Pavlov M.D.   On: 12/15/2017 12:50    Procedures Procedures (including critical care time)  Medications Ordered in ED Medications - No data to display   Initial Impression / Assessment and Plan / ED Course  I have reviewed the triage vital signs and the  nursing notes.  Pertinent labs & imaging results that were available during my care of the patient were reviewed by me and considered in my medical decision making (see chart for details).    Isaiah Wells is a 62 y.o. male who presents to ED for right lower extremity pain and swelling. Swelling to RLE has been occurring daily for about 1 year, however he typically does not experience any pain. Yesterday, he noticed pain to the ankle which worsened today. Afebrile and hemodynamically stable. NVI. He does have slight warmness to the ankle, but no erythema. Full ROM without any stiffness, but does report discomfort with ROM. X-ray obtained showing significant soft tissue swelling as well as passive erosive changes to the distal fibula.  Per radiology, osteomyelitis should be considered.  Discussed case with attending, Dr. Rush Landmark, who recommends obtaining labs and MRI for further characterization.  Labs reviewed and reassuring.  Normal white count.  MR pending at  shift change. Care assumed by oncoming provider PA McDonald. Case discussed, plan agreed upon. Will follow up on pending MRI. If concerns for osteomyelitis on imaging, likely will need orthopedic consult, IV ABX, admission. If MRI negative, likely PCP follow up (He does not have PCP currently and will need resources).   Patient discussed with Dr. Rush Landmarkegeler who agrees with treatment plan.    Final Clinical Impressions(s) / ED Diagnoses   Final diagnoses:  None    ED Discharge Orders    None       Ward, Chase PicketJaime Pilcher, PA-C 12/15/17 1710    Tegeler, Canary Brimhristopher J, MD 12/16/17 [phone]

## 2017-12-15 NOTE — ED Notes (Signed)
Pt returned to room from MRI.

## 2017-12-15 NOTE — ED Provider Notes (Addendum)
62 year old male received at sign out from [facility] pending MRI results. Per her history:   "Isaiah Wells is a 62 y.o. male  with a PMH of COPD who presents to the Emergency Department complaining of swelling and pain to right lower extremity. Patient reports that he has been struggling with swelling to the lower right leg and right ankle for over a year.  When he gets home from work, it is typically much more swollen.  He is not followed up with a primary care doctor for this.  Yesterday, he noticed much more pain than usual.  Swelling is still about the same.  This morning, the pain worsened.  Pain is worse with movement of the ankle as well as ambulation.  He denies any known injury or trauma.  He has broken the ankle before, but reports it was over 40 years ago and has not had any similar experiences to this in the past.  He also notes history of gout, however reports this is much different than his usual gout flares.  Denies any known skin changes or open wounds.  He has taken over-the-counter pain medication with some relief. No fever / chills."   Physical Exam  BP (!) 155/98 (BP Location: Right Arm)   Pulse (!) 52   Temp 97.7 F (36.5 C)   Resp 16   SpO2 98%   Physical Exam  Full active and passive range of motion motion of the right ankle.  Pain with active and passive range of motion.  DP pulses are 2+ and symmetric.  Sensation is intact throughout.  Edema noted to the right lower extremity, ankle.  Mildly warm to the touch over the right ankle.  No noted erythema.  There is a small superficial abrasion noted to the distal aspect of the anterior right lower leg with no surrounding erythema or warmth.   ED Course/Procedures     Procedures  MDM   62 year old male received from [facility] pending MRI results.  The patient has previously been discussed and evaluated by Dr. Sherry Ruffing, attending physician.  Spoke with Dr. Francoise Ceo, radiologist. She is covering [facility] MRIs for the evening, but a  dedicated [facility] radiologist is not available until tomorrow.  She states that MRI is more concerning for inflammatory arthritis rather than osteomyelitis.  She notes fluid around the tendon sheath with some edema in the bone, and small lytic lesions in the talus and fibula.  She also mentions scattered marrow edema.  She also notes that MRI will be reviewed by [facility] radiologist in the a.m.  Spoke with Dr. Doran Durand, orthopedic surgeon.  He has personally reviewed the images and suspects osteoarthritis as opposed to inflammatory arthritis or osteomyelitis.  He recommends placing the patient in a boot and having them follow-up with him this week in the office.  He also has requested that an ESR and CRP be ordered today so he can follow-up in his office as well as perform a weightbearing x-ray.  Also discussed that the MRI will be reviewed for third time in the a.m. by the musculoskeletal radiologist. This plan has been discussed with the patient is wife who are agreeable at this time.  A referral to [facility] has been provided as the patient does not currently have medical insurance.  Symptomatic treatment for home has been discussed including RICE therapy.  Strict return precautions given.  He is hemodynamically stable and in no acute distress.  The patient is safe for discharge  home at this time.      Joline Maxcy A, PA-C 12/15/17 2130    Tegeler, Gwenyth Allegra, MD 12/16/17 [phone]

## 2018-04-25 IMAGING — MR MR [PERSON_NAME] LOW W/O CM*R*
18 series · 40 of 40 positions shown · non-contrast
Comparison: None.

CLINICAL DATA: Right lower leg swelling for over 1 year which
acutely worsened 12/14/2017. No known injury.

EXAM:
MRI OF LOWER RIGHT EXTREMITY WITHOUT CONTRAST
TECHNIQUE: Multiplanar, multisequence MR imaging of the right lower extremity
was performed. No intravenous contrast was administered.

[Series 2001: T2 fat-sat · axial · B · 5.0mm · 0.62mm/px · z∈[-34,+176]mm · 2 of 36 slices shown (1 of 2)]
[im 1/36]
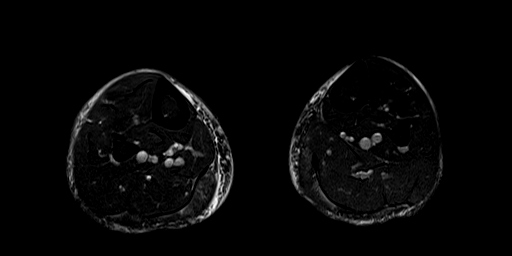
[im 36/36]
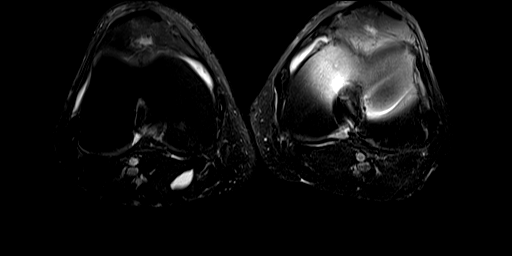

[Series 3001: T2 fat-sat · axial · B · 5.0mm · 0.62mm/px · z∈[-237,-28]mm · 2 of 36 slices shown (2 of 2)]
[im 1/36]
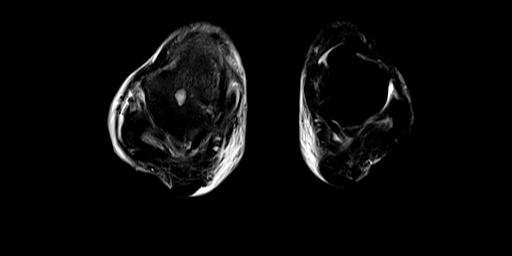
[im 36/36]
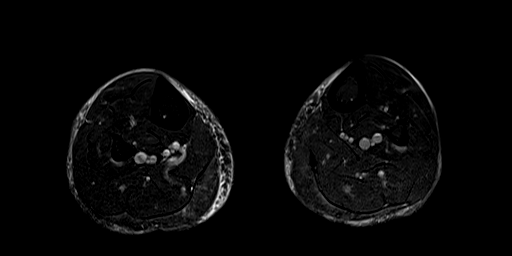

[Series 4001: T2 · axial · B · 5.0mm · 0.62mm/px · z∈[-237,+176]mm · 4 of 70 slices shown]
[im 1/70]
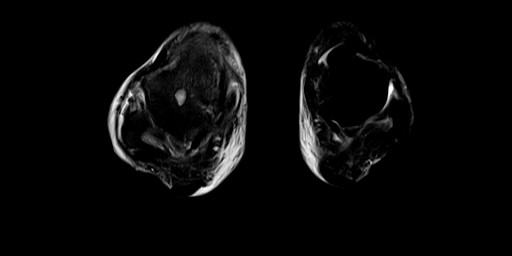
[im 24/70]
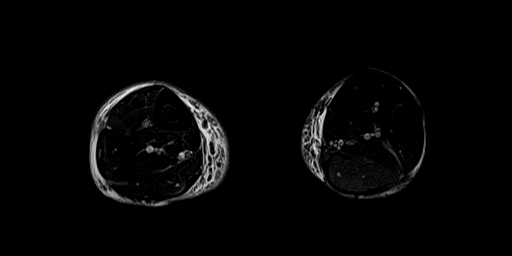
[im 47/70]
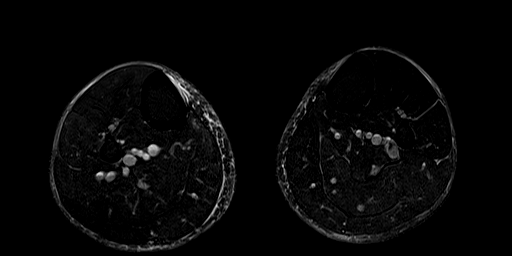
[im 70/70]
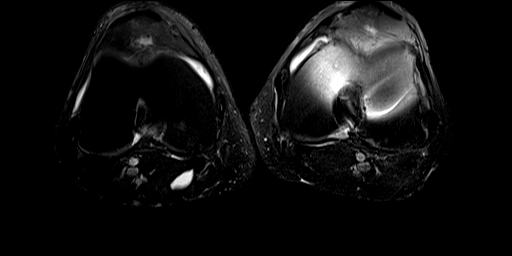

[Series 8001: STIR · coronal · B · 4.0mm · 0.83mm/px · 2 of 37 slices shown (1 of 4)]
[im 1/37]
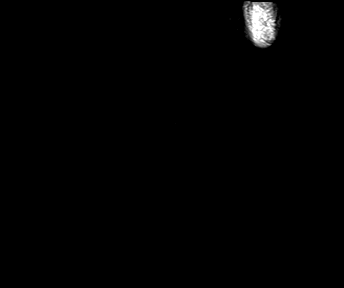
[im 37/37]
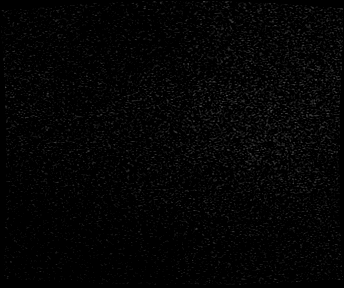

[Series 9001: STIR · coronal · B · 4.0mm · 0.83mm/px · 2 of 37 slices shown (2 of 4)]
[im 1/37]
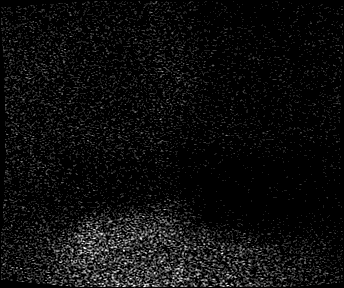
[im 37/37]
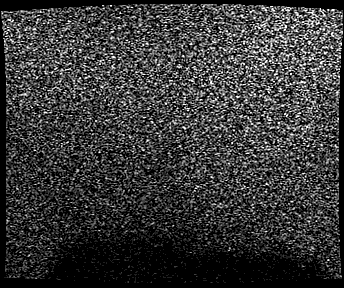

[composed cor stir_comp_filt · coronal · B · 4.5mm · 0.83mm/px · 2 of 37 slices shown]
[im 1/37]
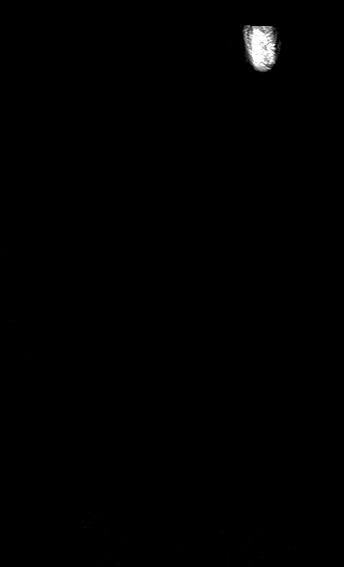
[im 37/37]
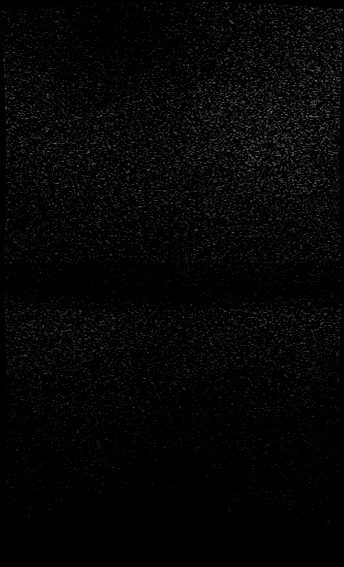

[T1 · coronal · B · 4.0mm · 0.83mm/px · 2 of 37 slices shown (1 of 7)]
[im 1/37]
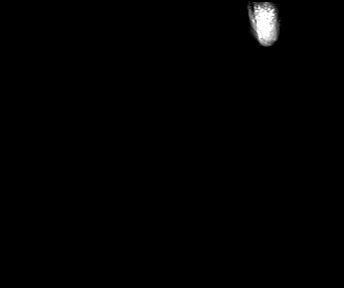
[im 37/37]
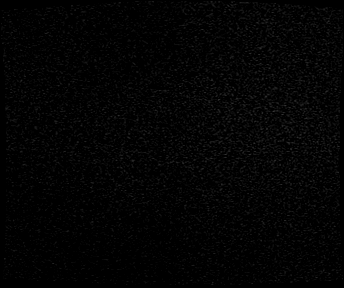

[T1 · coronal · B · 4.0mm · 0.83mm/px · 2 of 37 slices shown (2 of 7)]
[im 1/37]
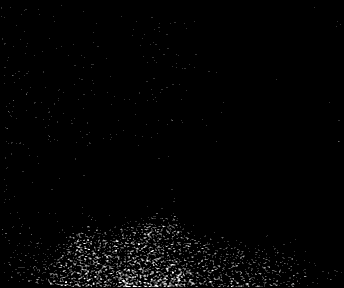
[im 37/37]
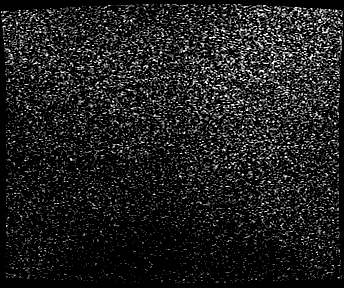

[composed cor t1_comp_filt · coronal · B · 4.5mm · 0.83mm/px · 2 of 37 slices shown]
[im 1/37]
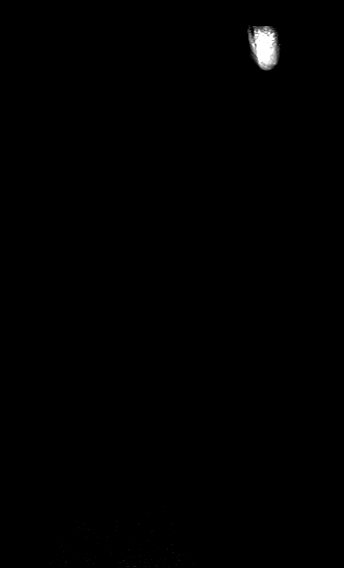
[im 37/37]
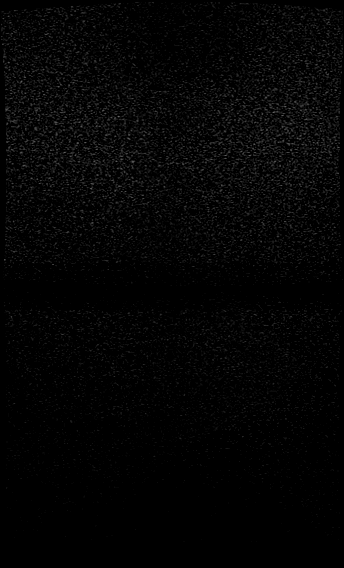

[T1 · axial · B · 5.0mm · 0.62mm/px · z∈[-34,+176]mm · 2 of 36 slices shown (3 of 7)]
[im 1/36]
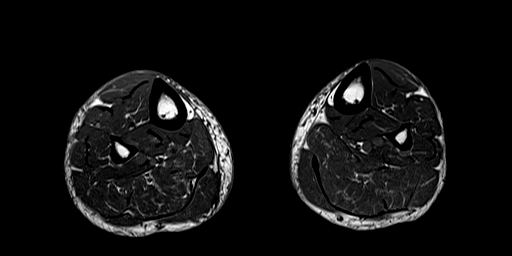
[im 36/36]
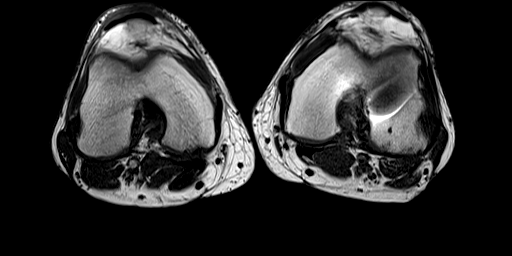

[T1 · axial · B · 5.0mm · 0.62mm/px · z∈[-237,-28]mm · 2 of 36 slices shown (4 of 7)]
[im 1/36]
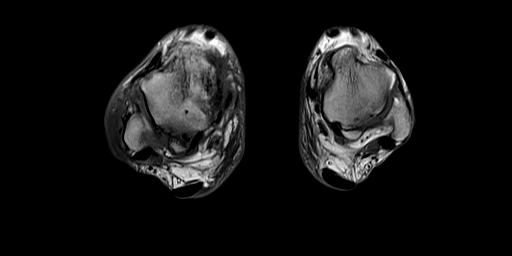
[im 36/36]
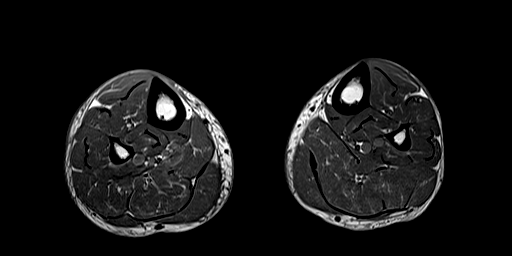

[T1 · axial · B · 5.0mm · 0.62mm/px · z∈[-237,+176]mm · 4 of 70 slices shown (5 of 7)]
[im 1/70]
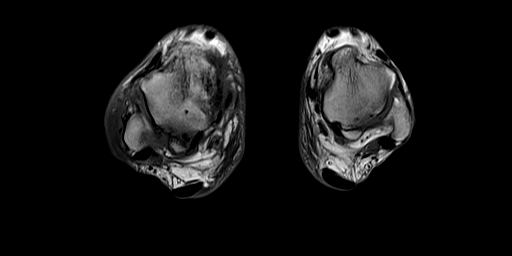
[im 24/70]
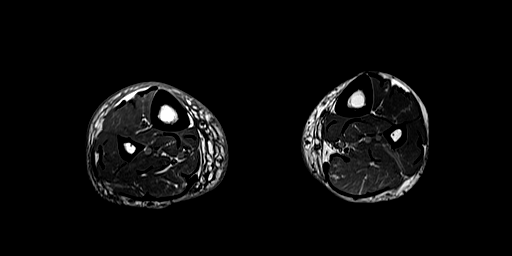
[im 47/70]
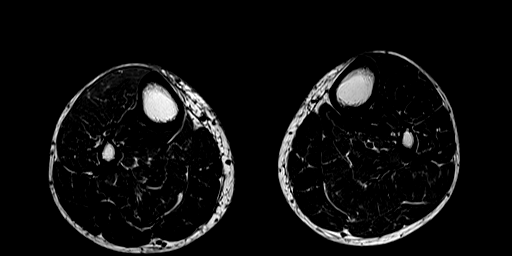
[im 70/70]
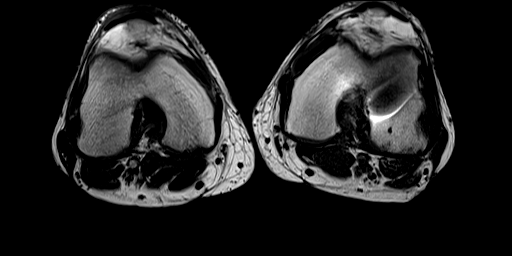

[STIR · sagittal · B · 4.0mm · 0.94mm/px · 2 of 30 slices shown (3 of 4)]
[im 1/30]
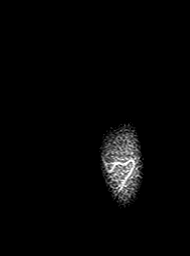
[im 30/30]
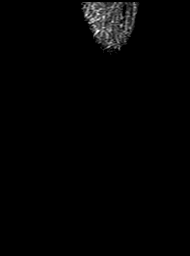

[STIR · sagittal · B · 4.0mm · 0.94mm/px · 2 of 30 slices shown (4 of 4)]
[im 1/30]
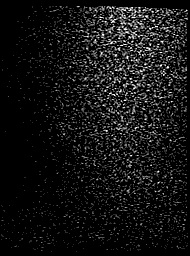
[im 30/30]
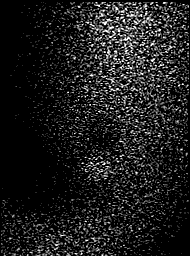

[composed sag stir_comp_filt · sagittal · B · 4.5mm · 0.94mm/px · 2 of 30 slices shown]
[im 1/30]
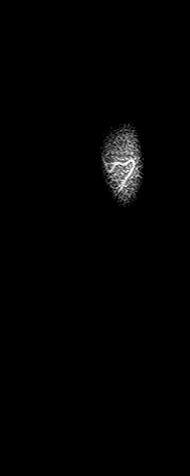
[im 30/30]
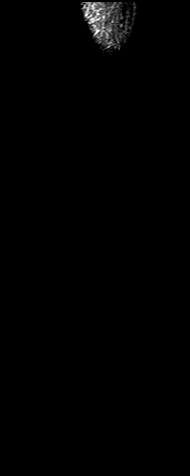

[T1 · sagittal · B · 4.0mm · 0.94mm/px · 2 of 30 slices shown (6 of 7)]
[im 1/30]
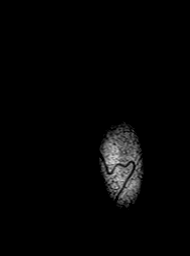
[im 30/30]
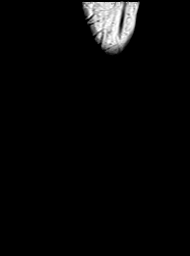

[T1 · sagittal · B · 4.0mm · 0.94mm/px · 2 of 30 slices shown (7 of 7)]
[im 1/30]
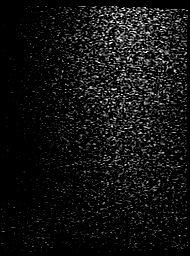
[im 30/30]
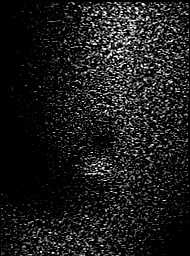

[composed sag t1_comp_filt · sagittal · B · 4.5mm · 0.94mm/px · 2 of 30 slices shown]
[im 1/30]
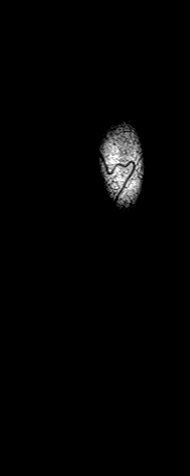
[im 30/30]
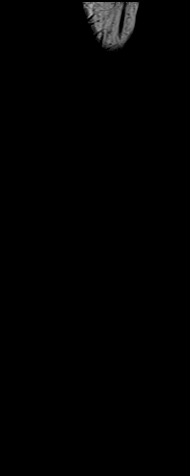

[40 of 40 positions shown; findings below may reference images not displayed]

FINDINGS: The axial and coronal sequences incidentally include the left lower
leg.

Bones/Joint/Cartilage

There is no bone marrow edema to suggest osteomyelitis. ACL graft
left knee is intact. There is degenerative disease about both knees.
Subchondral edema is seen about the medial compartment of the right
knee. Subchondral cyst formation is seen inferior pole of the right
patella.

Ligaments

The patient's ACL graft appears intact. No ligament tear is
identified.

Muscles and Tendons

No intramuscular fluid collection or edema to suggest infectious or
inflammatory change is seen. No muscle or tendon tear is identified.

Soft tissues

Subcutaneous edema is present bilaterally and increases in intensity
about the distal lower legs. Edema is worse on the right. No focal
fluid collection is seen. Small Baker's cyst right knee is noted.
IMPRESSION: Right worse than left subcutaneous edema increases in intensity
about the distal lower legs. Edema could be due to cellulitis or
dependent change.

Negative for myositis, abscess or osteomyelitis.

Osteoarthritis about both knees. The patient's left ACL graft is
intact.

Small right Baker's cyst.

## 2019-08-25 ENCOUNTER — Emergency Department (HOSPITAL_BASED_OUTPATIENT_CLINIC_OR_DEPARTMENT_OTHER)
Admission: EM | Admit: 2019-08-25 | Discharge: 2019-08-25 | Disposition: A | Discharge status: Home / Self Care | Payer: Self-pay | Attending: Emergency Medicine | Admitting: Emergency Medicine

## 2019-08-25 ENCOUNTER — Encounter (HOSPITAL_BASED_OUTPATIENT_CLINIC_OR_DEPARTMENT_OTHER): Payer: Self-pay | Admitting: Emergency Medicine

## 2019-08-25 ENCOUNTER — Other Ambulatory Visit: Payer: Self-pay

## 2019-08-25 DIAGNOSIS — M792 Neuralgia and neuritis, unspecified: Secondary | ICD-10-CM

## 2019-08-25 DIAGNOSIS — M79602 Pain in left arm: Secondary | ICD-10-CM | POA: Insufficient documentation

## 2019-08-25 DIAGNOSIS — M62838 Other muscle spasm: Secondary | ICD-10-CM

## 2019-08-25 DIAGNOSIS — J45909 Unspecified asthma, uncomplicated: Secondary | ICD-10-CM | POA: Insufficient documentation

## 2019-08-25 DIAGNOSIS — F17210 Nicotine dependence, cigarettes, uncomplicated: Secondary | ICD-10-CM | POA: Insufficient documentation

## 2019-08-25 DIAGNOSIS — M542 Cervicalgia: Secondary | ICD-10-CM

## 2019-08-25 DIAGNOSIS — J449 Chronic obstructive pulmonary disease, unspecified: Secondary | ICD-10-CM | POA: Insufficient documentation

## 2019-08-25 MED ORDER — CYCLOBENZAPRINE HCL 10 MG PO TABS: 10.0000 mg | ORAL_TABLET | Freq: Two times a day (BID) | ORAL | 0 refills | Status: AC | PRN

## 2019-08-25 MED ORDER — PREDNISONE 20 MG PO TABS: ORAL_TABLET | ORAL | 0 refills | Status: AC

## 2019-08-25 NOTE — ED Provider Notes (Signed)
[facility] EMERGENCY DEPARTMENT Provider Note   CSN: [id number] Arrival date & time: 08/25/19  3903     History Chief Complaint  Patient presents with  . Torticollis    TRAVES MAJCHRZAK is a 63 y.o. male.  The history is provided by the patient and medical records.  Muscle Pain This is a new problem. The current episode started more than 1 week ago. The problem occurs constantly. The problem has not changed since onset.Pertinent negatives include no chest pain, no abdominal pain, no headaches and no shortness of breath. The symptoms are aggravated by twisting. Nothing relieves the symptoms. He has tried nothing for the symptoms. The treatment provided no relief.       Past Medical History:  Diagnosis Date  . Asthma   . COPD (chronic obstructive pulmonary disease) (HCC)     There are no problems to display for this patient.   Past Surgical History:  Procedure Laterality Date  . KNEE ARTHROSCOPY W/ ACL RECONSTRUCTION         No family history on file.  Social History   Tobacco Use  . Smoking status: Current Every Day Smoker    Packs/day: 1.00    Types: Cigarettes  . Smokeless tobacco: Never Used  Substance Use Topics  . Alcohol use: No  . Drug use: No    Home Medications Prior to Admission medications   Medication Sig Start Date End Date Taking? Authorizing Provider  albuterol (PROVENTIL HFA;VENTOLIN HFA) 108 (90 BASE) MCG/ACT inhaler Inhale 2 puffs into the lungs every 6 (six) hours as needed. For shortness of breath    [provider]  albuterol (PROVENTIL) (2.5 MG/3ML) 0.083% nebulizer solution Take 2.5 mg by nebulization every 6 (six) hours as needed. For shortness of breath    [provider]  Aspirin-Salicylamide-Caffeine (BC HEADACHE POWDER PO) Take 1 each by mouth daily as needed (headache).    [provider]  fexofenadine (ALLEGRA) 180 MG tablet Take 180 mg by mouth daily as needed for allergies or rhinitis.     [provider]  hydrOXYzine (ATARAX/VISTARIL) 25 MG tablet Take 1 tablet (25 mg total) by mouth every 6 (six) hours. Patient not taking: Reported on 12/15/2017 08/08/16   Linna Hoff, MD    Allergies    Patient has no known allergies.  Review of Systems   Review of Systems  Constitutional: Negative for chills, diaphoresis, fatigue and fever.  HENT: Negative for congestion.   Eyes: Negative for visual disturbance.  Respiratory: Negative for cough, chest tightness and shortness of breath.   Cardiovascular: Negative for chest pain.  Gastrointestinal: Negative for abdominal pain, constipation, diarrhea, nausea and vomiting.  Genitourinary: Negative for flank pain.  Musculoskeletal: Positive for neck pain. Negative for back pain and neck stiffness.  Skin: Negative for rash and wound.  Neurological: Negative for seizures, syncope, speech difficulty, weakness, light-headedness, numbness and headaches.  Psychiatric/Behavioral: Negative for agitation.  All other systems reviewed and are negative.   Physical Exam Updated Vital Signs BP (!) 166/114 (BP Location: Right Arm)   Pulse 77   Temp (!) 97.5 F (36.4 C) (Oral)   Resp 18   Ht 6' (1.829 m)   Wt 90.7 kg   SpO2 98%   BMI 27.12 kg/m   Physical Exam Vitals and nursing note reviewed.  HENT:     Head: Normocephalic.     Nose: No congestion.  Eyes:     Conjunctiva/sclera: Conjunctivae normal.     Pupils:  Pupils are equal, round, and reactive to light.  Neck:      Comments: Good pulses in upper extremities.  Symmetric.  No focal neurologic deficits. Cardiovascular:     Rate and Rhythm: Normal rate.     Pulses: Normal pulses.     Heart sounds: No murmur.  Pulmonary:     Effort: Pulmonary effort is normal.  Chest:     Chest wall: No tenderness.  Abdominal:     General: Abdomen is flat.     Tenderness: There is no abdominal tenderness.  Musculoskeletal:        General: Tenderness present. No swelling.      Cervical back: Normal range of motion and neck supple. Tenderness present. No erythema, signs of trauma, rigidity, torticollis or crepitus. Pain with movement present. No spinous process tenderness. Normal range of motion.     Right lower leg: No edema.     Left lower leg: No edema.  Lymphadenopathy:     Cervical: No cervical adenopathy.  Skin:    Capillary Refill: Capillary refill takes less than 2 seconds.     Findings: No erythema or rash.  Neurological:     General: No focal deficit present.     Mental Status: He is alert.     Cranial Nerves: No cranial nerve deficit.     Sensory: No sensory deficit.     Motor: No weakness.     Gait: Gait normal.  Psychiatric:        Mood and Affect: Mood normal.     ED Results / Procedures / Treatments   Labs (all labs ordered are listed, but only abnormal results are displayed) Labs Reviewed - No data to display  EKG None  Radiology No results found.  Procedures Procedures (including critical care time)  Medications Ordered in ED Medications - No data to display  ED Course  I have reviewed the triage vital signs and the nursing notes.  Pertinent labs & imaging results that were available during my care of the patient were reviewed by me and considered in my medical decision making (see chart for details).    MDM Rules/Calculators/A&P                      Lenward Chancellorimothy E Harshbarger is a 63 y.o. male with a past medical history significant for asthma and COPD who presents with left arm pain shooting from his neck.  He reports that for the last week and half he has had the symptoms and denies any specific trauma.  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  He reports that he woke up 1 morning with pain in his left neck and thinks he may have laid on it wrong.  He reports no specific weakness and reports only transient tingling but no true numbness.  He denies any difficulty with grip strength.  He denies any headache or  midline neck tenderness.  He denies any nausea vomiting, vision changes or other symptoms.  No loss of bowel or bladder control or leg symptoms.  No history of neck injury or neck surgeries.  No other complaints on arrival.  He reports the pain is mild to moderate at times.  On exam, patient does have tenderness in his left lateral and paraspinal neck.  There is muscle spasm appreciated.  With palpation the radicular pain was reproduced going down his left arm.  There was no actual tenderness in the shoulder or arm and no  focal neurologic deficits.  Good pulses.  Normal shoulder range of motion.  Lungs clear and chest and back nontender otherwise.  No midline tenderness.  No erythema or warmth.  No other abnormalities on exam.  Clinically I suspect he has a radicular type pain coming from the nerves in his left neck.  Extremely low suspicion for infection or meningitis.  With no trauma, low suspicion for acute fracture or dislocation. with the muscle spasms, we will give muscle relaxant.  We had a shared decision-making conversation and agreed to hold on any imaging at this time given his lack of trauma and lack of history of this.  He is willing to do steroids and muscle relaxant and if symptoms not improve, follow-up with sports medicine at this facility to consider further imaging.  He agrees with plan of care and was discharged in good condition after prescriptions were provided.  Final Clinical Impression(s) / ED Diagnoses Final diagnoses:  Radicular pain in left arm  Neck pain on left side  Muscle spasm    Rx / DC Orders ED Discharge Orders         Ordered    cyclobenzaprine (FLEXERIL) 10 MG tablet  2 times daily PRN     08/25/19 0733    predniSONE (DELTASONE) 20 MG tablet     08/25/19 0733         Clinical Impression: 1. Radicular pain in left arm   2. Neck pain on left side   3. Muscle spasm     Disposition: Discharge  Condition: Good  I have discussed the results, Dx and Tx  plan with the pt(& family if present). He/she/they expressed understanding and agree(s) with the plan. Discharge instructions discussed at great length. Strict return precautions discussed and pt &/or family have verbalized understanding of the instructions. No further questions at time of discharge.    New Prescriptions   CYCLOBENZAPRINE (FLEXERIL) 10 MG TABLET    Take 1 tablet (10 mg total) by mouth 2 (two) times daily as needed for muscle spasms.   PREDNISONE (DELTASONE) 20 MG TABLET    Take 3 tabs daily for 5 days (60mg ), 2 tabs daily for 5 days (40mg ), then 1 tab daily for 5 days (20mg ).    Follow Up: Rosemarie Ax, MD [place] [address] [phone]     [facility] EMERGENCY DEPARTMENT [address] [id number] [address] [facility] [place] [phone]       Oneil Behney, Gwenyth Allegra, MD 08/25/19 [phone]

## 2019-08-25 NOTE — ED Notes (Signed)
Pt verbalized understanding of dc instructions.

## 2019-08-25 NOTE — ED Triage Notes (Signed)
Pt c/o left sided neck pain radiating down arm and numbness to left arm.

## 2019-08-25 NOTE — Discharge Instructions (Signed)
Your history and exam today are consistent with left-sided nerve pain coming from your neck with associated muscle spasm and discomfort.  You did not have any other red flags we discussed such as persistent weakness or numbness.  You did not have midline tenderness on her neck and as we discussed, we agreed to hold on imaging at this time.  Please use the muscle relaxant and the steroids to help with the symptoms.  If symptoms not improve, please return to the nearest emergency department or follow-up with sports medicine.

## 2020-01-20 ENCOUNTER — Ambulatory Visit: Payer: Self-pay | Attending: Internal Medicine

## 2020-01-20 DIAGNOSIS — Z20822 Contact with and (suspected) exposure to covid-19: Secondary | ICD-10-CM

## 2020-01-21 LAB — SARS-COV-2, NAA 2 DAY TAT

## 2020-01-21 LAB — NOVEL CORONAVIRUS, NAA: SARS-CoV-2, NAA: NOT DETECTED

## 2020-06-27 DEATH — deceased
